# Patient Record
Sex: Female | Born: 1950 | Race: Black or African American | Hispanic: No | Marital: Single | Smoking: Former smoker
Health system: Southern US, Community
[De-identification: ages and names within clinical notes are randomized; demographics above are authoritative.]

## PROBLEM LIST (undated history)

## (undated) DIAGNOSIS — R351 Nocturia: Secondary | ICD-10-CM

## (undated) DIAGNOSIS — M2540 Effusion, unspecified joint: Secondary | ICD-10-CM

## (undated) DIAGNOSIS — R519 Headache, unspecified: Secondary | ICD-10-CM

## (undated) DIAGNOSIS — F32A Depression, unspecified: Secondary | ICD-10-CM

## (undated) DIAGNOSIS — M545 Low back pain, unspecified: Secondary | ICD-10-CM

## (undated) DIAGNOSIS — M2550 Pain in unspecified joint: Secondary | ICD-10-CM

## (undated) DIAGNOSIS — R609 Edema, unspecified: Secondary | ICD-10-CM

## (undated) DIAGNOSIS — I10 Essential (primary) hypertension: Secondary | ICD-10-CM

## (undated) DIAGNOSIS — F329 Major depressive disorder, single episode, unspecified: Secondary | ICD-10-CM

## (undated) DIAGNOSIS — M1990 Unspecified osteoarthritis, unspecified site: Secondary | ICD-10-CM

## (undated) DIAGNOSIS — G629 Polyneuropathy, unspecified: Secondary | ICD-10-CM

## (undated) DIAGNOSIS — H269 Unspecified cataract: Secondary | ICD-10-CM

## (undated) DIAGNOSIS — R600 Localized edema: Secondary | ICD-10-CM

## (undated) DIAGNOSIS — R51 Headache: Secondary | ICD-10-CM

## (undated) DIAGNOSIS — E785 Hyperlipidemia, unspecified: Secondary | ICD-10-CM

## (undated) DIAGNOSIS — G8929 Other chronic pain: Secondary | ICD-10-CM

## (undated) HISTORY — PX: COLONOSCOPY: SHX174

## (undated) HISTORY — PX: TOTAL KNEE ARTHROPLASTY: SHX125

## (undated) HISTORY — PX: JOINT REPLACEMENT: SHX530

## (undated) HISTORY — PX: KNEE ARTHROSCOPY: SHX127

## (undated) HISTORY — PX: BACK SURGERY: SHX140

## (undated) HISTORY — PX: CHOLECYSTECTOMY OPEN: SUR202

## (undated) HISTORY — PX: VAGINAL HYSTERECTOMY: SUR661

## (undated) HISTORY — PX: POSTERIOR FUSION CERVICAL SPINE: SUR628

---

## 2002-06-01 ENCOUNTER — Ambulatory Visit (HOSPITAL_COMMUNITY): Admission: RE | Admit: 2002-06-01 | Discharge: 2002-06-01 | Payer: Self-pay | Admitting: Geriatric Medicine

## 2002-06-01 ENCOUNTER — Encounter: Payer: Self-pay | Admitting: Internal Medicine

## 2003-07-17 ENCOUNTER — Inpatient Hospital Stay (HOSPITAL_COMMUNITY): Admission: RE | Admit: 2003-07-17 | Discharge: 2003-07-20 | Payer: Self-pay | Admitting: Orthopedic Surgery

## 2003-07-17 ENCOUNTER — Encounter (INDEPENDENT_AMBULATORY_CARE_PROVIDER_SITE_OTHER): Payer: Self-pay | Admitting: *Deleted

## 2003-07-20 ENCOUNTER — Inpatient Hospital Stay (HOSPITAL_COMMUNITY)
Admission: RE | Admit: 2003-07-20 | Discharge: 2003-07-29 | Payer: Self-pay | Admitting: Physical Medicine & Rehabilitation

## 2004-11-13 ENCOUNTER — Ambulatory Visit (HOSPITAL_COMMUNITY): Admission: RE | Admit: 2004-11-13 | Discharge: 2004-11-13 | Payer: Self-pay | Admitting: Geriatric Medicine

## 2005-11-14 ENCOUNTER — Ambulatory Visit (HOSPITAL_COMMUNITY): Admission: RE | Admit: 2005-11-14 | Discharge: 2005-11-14 | Payer: Self-pay | Admitting: Pulmonary Disease

## 2005-11-14 ENCOUNTER — Ambulatory Visit (HOSPITAL_COMMUNITY): Admission: RE | Admit: 2005-11-14 | Discharge: 2005-11-14 | Payer: Self-pay | Admitting: Internal Medicine

## 2006-03-04 ENCOUNTER — Emergency Department (HOSPITAL_COMMUNITY): Admission: EM | Admit: 2006-03-04 | Discharge: 2006-03-04 | Payer: Self-pay | Admitting: Emergency Medicine

## 2006-07-13 ENCOUNTER — Inpatient Hospital Stay (HOSPITAL_COMMUNITY): Admission: RE | Admit: 2006-07-13 | Discharge: 2006-07-17 | Payer: Self-pay | Admitting: Orthopedic Surgery

## 2006-07-14 ENCOUNTER — Ambulatory Visit: Payer: Self-pay | Admitting: Physical Medicine & Rehabilitation

## 2006-10-09 ENCOUNTER — Ambulatory Visit: Payer: Self-pay

## 2006-11-25 ENCOUNTER — Ambulatory Visit (HOSPITAL_COMMUNITY): Admission: RE | Admit: 2006-11-25 | Discharge: 2006-11-25 | Payer: Self-pay | Admitting: Internal Medicine

## 2008-06-09 ENCOUNTER — Ambulatory Visit (HOSPITAL_COMMUNITY): Admission: RE | Admit: 2008-06-09 | Discharge: 2008-06-09 | Payer: Self-pay | Admitting: Internal Medicine

## 2009-08-24 ENCOUNTER — Ambulatory Visit (HOSPITAL_COMMUNITY): Admission: RE | Admit: 2009-08-24 | Discharge: 2009-08-24 | Payer: Self-pay | Admitting: Internal Medicine

## 2010-06-02 ENCOUNTER — Encounter: Payer: Self-pay | Admitting: Internal Medicine

## 2010-09-27 NOTE — Op Note (Signed)
NAMEJAZMEN, LINDENBAUM              ACCOUNT NO.:  [id number]   MEDICAL RECORD NO.:  [id number]          PATIENT TYPE:  INP   LOCATION:  5017                         FACILITY:  [facility]   PHYSICIAN:  Loreta Ave, M.D. DATE OF BIRTH:  Nov 14, 1950   DATE OF PROCEDURE:  07/13/2006  DATE OF DISCHARGE:                               OPERATIVE REPORT   PREOPERATIVE DIAGNOSES:  End-stage degenerative arthritis, left knee,  valgus alignment; morbid obesity.   POSTOPERATIVE DIAGNOSES:  End-stage degenerative arthritis, left knee,  valgus alignment; morbid obesity.   PROCEDURE:  Left total knee replacement, Stryker prosthesis, soft tissue  balancing, cemented pegged posterior-stabilized #4 femoral component,  Triathlon type, cemented #4 tibial component with 9-mm polyethylene  insert, posterior stabilized, 32-mm medial offset cemented patellar  component.   SURGEON:  Loreta Ave, M.D.   ASSISTANT:  Genene Churn. Denton Meek., present throughout the entire case.   ANESTHESIA:  General.   BLOOD LOSS:  Minimal.   TOURNIQUET TIME:  1 hour 50 minutes.   SPECIMENS:  None.   CULTURES:  None.   COMPLICATIONS:  None.   DRESSING:  Sterile compressive with knee immobilizer.   DRAINS:  A Hemovac x1.   DESCRIPTION OF PROCEDURE:  The patient was brought to the operating room  and placed on the operating table in supine position.  After adequate  anesthesia had been obtained, the left knee was examined.  Positioning,  exam and the entire procedure made much more difficult because of  exogenous obesity.  Basically, full extension, increased valgus,  correctable to normal mechanical axis.  Lateral patellofemoral tracking  and tethering, although not extreme.  Flexion at about 90 degrees,  limited by her calf hitting the back of her thigh.  After complete exam,  tourniquet applied, prepped and draped usual sterile fashion.  Exsanguinated with elevation and Esmarch.  Tourniquet inflated to 350  mmHg.  Anterior incision above the patella down to tibial tubercle.  The  skin and subcutaneous tissue divided.  Medial arthrotomy extending up  into the distal quad.  Knee exposed.  Grade 4 changes throughout.  Numerous loose bodies.  Peri-articular spurs.  Remnants of menisci,  cruciate ligaments removed.  Distal femur exposed.  Intramedullary guide  placed.  Distal cut set with a normal mechanical axis, obviously  resecting more medially because of bony deficiency laterally.  Epicondylar axis marked after a 10-mm resection.  Sized, cut and fitted  with a #4 component, posterior stabilized and pegged.  Proximal tibia  resection, 3-degree posterior slope cut.  Extramedullary guide coming  down just low enough to be in subchondral bone laterally.  Multiple  drilling on top of the tibia laterally, as it was very sclerotic.  Sized  to a #4 component.  Trials were put in place, a #4 on the femur, a #4 on  the tibia, with a 9-mm insert.  After soft tissue balancing, I had full  extension, full flexion and a nicely balanced knee with good stability.  Tibia was marked for appropriate rotation and hand-reamed.  Patella was  then treated with resection of all the peri-articular  spurs.  Posterior  10 mm resected.  Sized, drilled and fitted for a 32-mm component.  With  trials in place again after freeing up soft tissues, especially  laterally, I had good patellofemoral tracking and stability.  All trials  removed.  All recesses examined.  Numerous loose bodies removed.  Copious irrigation with a pulsed irrigating device.  Cement prepared and  placed on all components, which were then firmly seated.  Excessive  cement removed.  Polyethylene attached to the tibia and the knee  reduced.  After the cement hardened, the knee was reexamined.  Full  extension, full flexion, good patellofemoral tracking and stability.  Hemovac placed.  Arthrotomy closed with #1 Vicryl, the skin and  subcutaneous tissue  with Vicryl and staples.  The knee injected with  Marcaine and Hemovac clamped.  Sterile compressive dressing applied.  Tourniquet deflated and removed.  Knee immobilizer applied.  Anesthesia  reversed.  Brought to the recovery room.  Tolerated surgery well with no  complications.      Loreta Ave, M.D.  Electronically Signed     DFM/MEDQ  D:  07/13/2006  T:  07/13/2006  Job:  [id number]

## 2010-09-27 NOTE — Op Note (Signed)
NAMEARMANI, GAWLIK                        ACCOUNT NO.:  [id number]   MEDICAL RECORD NO.:  [id number]                   PATIENT TYPE:  INP   LOCATION:  5004                                 FACILITY:  [facility]   PHYSICIAN:  Loreta Ave, M.D.              DATE OF BIRTH:  Dec 29, 1950   DATE OF PROCEDURE:  07/17/2003  DATE OF DISCHARGE:                                 OPERATIVE REPORT   PREOPERATIVE DIAGNOSES:  End stage degenerative arthritis, right knee.  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  Marked exogenous obesity.   POSTOPERATIVE DIAGNOSES:  End stage degenerative arthritis, right knee.  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  Marked exogenous obesity.   OPERATION PERFORMED:  Right total knee replacement Osteonics prosthesis.  Cemented #9 posterior stabilized femoral component.  Cemented #9 tibial  component with 15 mm posterior stabilized Flex insert.  Cemented recess 30  mm patellar component.  Removal of numerous osteochondral loose bodies and  relatively extensive synovectomy.   SURGEON:  Loreta Ave, M.D.   ASSISTANT:  Arlys John D. Petrarca, P.A.-C.   ANESTHESIA:  General.   ESTIMATED BLOOD LOSS:  Minimal.   SPECIMENS:  Loose bodies and synovium.   CULTURES:  None.   COMPLICATIONS:  None.   DRESSING:  Soft compressive.   TOURNIQUET TIME:  One hour and 30 minutes.   DRAINS:  Hemovac times two.   DESCRIPTION OF PROCEDURE:  The patient was brought to the operating room and  placed on the operating table in supine position.  After adequate anesthesia  had been obtained, the knee examined.  Almost full extension.  Increased  valgus alignment, grossly stable, flexion to 90 degrees.  Exam and all  exposure extremely difficult because of the degree of exogenous obesity.  The lower leg was too large to be fitted  with any standard TED hose.  Tourniquet applied to upper aspect of the thigh.  Prepped and draped in the  usual sterile fashion.  Exsanguinated elevation Esmarch.  Tourniquet  inflated to 400 mmHg.  Straight incision above the patella down to the  tibial tubercle.  Multiple layers of adipose tissue incised exposing the  extensor mechanism.  Appropriate retractors.  Medial parapatellar  arthrotomy.  Knee exposed.  There were more than 14 greater than 2 cm  osteochondral loose bodies counted throughout the knee and all of these were  removed.  Extensive periarticular spurs removed.  Grade 4 changes  throughout.  Hypertrophic synovium but I really did not see pebbling or  loose body formation within the synovium itself.  All of the hypertrophic  synovium resected from all compartments and part of this was sent to  pathology to rule out an osteochondroma formation pattern within the  synovium.  Remnants of menisci and cruciate ligaments exposed.  Distal femur  exposed.  Intramedullary guide  placed.  Distal cut of the femur removing 12  mm set at 5 degrees of valgus.  Good bone stock.  Sized to a #9 component.  Jigs put in place.  Definitive cuts made.  Attention was turned to the  tibia.  The tibial spine removed with the saw.  The intramedullary guide  placed.  Proximal cut removing 6 mm with a 5 degree posterior slope cut.  Patella was then sized, reamed and drilled for a 30 mm component.  Trials  put in place throughout.  #9 on the femur, #9 on the tibia and a 30 mm  patella.  With appropriate soft tissue balancing and release, a 15 mm Flex  insert was utilized. This gave me full extension, full flexion, nicely  balanced knee set at 5 degrees of valgus.  Tibia was marked for appropriate  rotation with the trials and then hand reamed.  At completion, the entire  knee was copiously irrigated with a pulse irrigating device.  All recesses  examined.  All loose bodies removed.  Cement placed on  all components which  were firmly seated and the polyethylene attached to tibial component.  After  cement had hardened and excessive cement removed, the knee was re-examined.  Full extension, flexion better than 90 degrees beyond which adipose tissue  between the thigh and the calf limited further flexion.  Good patellofemoral  tracking.  Wound was irrigated.  Hemovacs placed and brought out through  separate stab wounds.  Arthrotomy closed with #1 Vicryl.  Skin and  subcutaneous tissue with multiple layers of Vicryl and then staples on the  skin.  Margins of the wound and knee then injected with Marcaine.  Hemovacs  clamped.  Sterile compressive dressing applied.  Tourniquet deflated and  removed.  Anesthesia reversed.  Brought to recovery room.  Tolerated surgery  well.  No complications.                                               Loreta Ave, M.D.    DFM/MEDQ  D:  07/19/2003  T:  07/19/2003  Job:  [id number]

## 2010-09-27 NOTE — Discharge Summary (Signed)
Dawn Gross, Dawn Gross                        ACCOUNT NO.:  [id number]   MEDICAL RECORD NO.:  [id number]                   PATIENT TYPE:  INP   LOCATION:  5004                                 FACILITY:  [facility]   PHYSICIAN:  Loreta Ave, M.D.              DATE OF BIRTH:  [birth year]   DATE OF ADMISSION:  07/17/2003  DATE OF DISCHARGE:  07/20/2003                                 DISCHARGE SUMMARY   ADMISSION DIAGNOSIS:  Advanced degenerative joint disease of the right knee.   DISCHARGE DIAGNOSES:  1. Advanced degenerative joint disease of the right knee.  2. Hypertension.  3. Esophageal reflux.  4. Depressive disorder.   PROCEDURE:  Right total knee replacement.   HISTORY:  A 60 year old black female with advanced DJD of the right knee.  She had arthroscopic surgery approximately five to six years ago with  removal of loose bodies without much improvement.  She failed conservative  treatment.  She is now indicated for right total knee replacement.   HOSPITAL COURSE:  A 60 year old black female admitted July 17, 2003, after  appropriate laboratory studies were obtained as well as 1 g Ancef IV on call  to the operating room.  Was taken to the operating room where she underwent  a right total knee replacement.  She tolerated the procedure well.  She was  continued on Ancef 1 g IV q.8h. x 3 doses.  Heparin 5000 units  subcutaneously q.12h. was begun until Coumadin became therapeutic.  Placed  on high-dose Dilaudid PCA pump.  A Foley was placed intraoperatively.  CPM  from 0 to 50 degrees for 8 hours per day, increasing by 10 degrees a day was  ordered.  Consultation with PT, OT, and rehab made.  Ambulation  weightbearing as tolerated.  Foot pumps were placed to both legs  postoperatively.  Incentive spirometry was encouraged.  On March 9, Hemovacs  were pulled and her dressing was changed.  Wounds were benign.  She was  weaned off oral pain medicines.  She did have hypokalemia  which was  corrected with oral potassium.  She did have some elevation in her  temperature, and chest x-ray was ordered.  The remainder of her hospital  course, though, was unremarkable, and she was transferred to rehab on March  10.  She will return back to our office 10 to 14 days from surgery.   CONDITION ON DISCHARGE:  Improved.   LABORATORY AND X-RAY DATA:  Chest x-ray of July 20, 2003, revealed streaky  areas of atelectasis of both lower lobes.   Right knee on March 7 revealed right total knee replacement appears to be in  satisfactory position and alignment.  Postoperative changes are noted and  drain in the suprapatellar bursal space.   Laboratory studies show hemoglobin 12.8, hematocrit 38.6%, white count 5400,  and platelets 276,000.  Discharge hemoglobin 8.6, hematocrit 25.7%, white  count 6900, platelets 194,000.  Pro time at time of discharge was 14.2 with  an INR of 1.1.  Preop chemistries: Sodium 140, potassium 3.2, chloride 104,  CO2 29, glucose 88, BUN 14, creatinine 0.7, calcium 9.3, total protein 6.3,  albumin 3.7, AST 26, ALT 27, alkaline phosphatase 83, total bilirubin 0.4.  Discharge sodium 136, potassium 3.2, chloride 100, CO2 31, glucose 100, BUN  7, creatinine 0.7, calcium 8.3.  Urinalysis was benign for blood in urine.  Blood type was O positive, antibody screen negative.   DISCHARGE INSTRUCTIONS:  She was transferred to rehab where she will  continue with physical and occupational therapy as per protocol.  She was  discharged in improved condition on a house diet.      Oris Drone Petrarca, P.A.-C.                Loreta Ave, M.D.    BDP/MEDQ  D:  08/26/2003  T:  08/28/2003  Job:  [id number]

## 2010-09-27 NOTE — Discharge Summary (Signed)
Dawn Gross, Dawn Gross              ACCOUNT NO.:  [id number]   MEDICAL RECORD NO.:  [id number]          PATIENT TYPE:  INP   LOCATION:  5017                         FACILITY:  [facility]   PHYSICIAN:  Loreta Ave, M.D. DATE OF BIRTH:  24-Oct-1950   DATE OF ADMISSION:  07/13/2006  DATE OF DISCHARGE:  07/17/2006                               DISCHARGE SUMMARY   FINAL DIAGNOSES:  1. Status post left total knee replacement for end-stage degenerative      joint disease.  2. Hypertension.  3. Gastroesophageal reflux disease.  4. Diet controlled diabetes.  5. Hyperlipidemia.   HISTORY OF PRESENT ILLNESS:  A 60 year old white female with history of  DJD, left knee chronic pain, presented to our office for evaluation for  total knee replacement.  She had progressively worsening pain with  __________ response to conservative treatment.  Significant decrease in  her daily activities due to the ongoing complaint.   HOSPITAL COURSE:  07/13/06 the patient was taken to the [facility] OR  and a left total knee replacement procedure performed.  The surgeon  Loreta Ave, assistant Genene Churn. Barry Dienes, PA-C.  Anesthesia general.  No specimens.  EBL minimal.  One Hemovac drain placed.  Surgery time 2  hours.  There were no surgical or anesthesia complications and the  patient was transferred to recovery in stable condition.  07/14/06 the  patient doing well with good pain control.  No specific complaints.  Temperature 98.8, pulse 109, respirations 20, blood pressure 113/75.  WBCs 7.5, hemoglobin 9.9, hematocrit 29.5, platelets 186.  Sodium 137,  potassium 3, chloride 100, CO2 26, BUN 11, creatinine 0.58, glucose 142.  INR 1.1.  Dressing clean, dry and intact.  Calf nontender.  Neurovascularly intact.  PT and OT consult.  Pharmacy protocol Coumadin  started.  Replace potassium.  FeSO4 325 mg p.o. b.i.d. started.  Changed  morphine PCA to low-dose.  07/15/06 the patient doing well.  Temperature  100.6, pulse 112, respirations 20, blood pressure 112/67.  Hemoglobin  9.7, hematocrit 28.3.  Potassium 3.4, sodium 137, chloride 102, CO2 31,  BUN 10, creatinine 0.75, glucose 110.  INR 1.2.  Wound looked good,  staples intact.  No signs of infection.  Hemovac drain discontinued.  Calf nontender, neurovascularly intact.  Discontinued IV, PCA, O2 and  Foley.  Started Kay Ciel 20 mEq p.o. b.i.d.  Encourage incentive  spirometry.  07/16/06, the patient doing well with good pain control.  Temperature 98.1, pulse 69, respirations 18, blood pressure 107/79.  Hemoglobin 9.7, hematocrit 24.3, platelets 212.  INR 1.1.  B-met  pending.  Urine culture negative.  Would looked good, staples intact.  No drainage or signs of infection.  Calf nontender.  Neurovascularly  intact.  Awaiting rehab versus skilled nursing facility placement.  07/17/06, the patient doing very well.  Good pain control.  Complained  of some knee pain last night but nothing extreme.  Temperature 97.7,  pulse 83, respirations 18, blood pressure 110/68.  WBCs 6.2, hematocrit  25.4, hemoglobin 8.7, platelets 186.  Sodium 139, potassium 3.8,  chloride 98, CO2 29, BUN 11,  creatinine 0.54, glucose 96.  INR 1.3.  Wound looked good.  Staples intact.  No drainage or signs of infection.  Calf nontender.  Neurovascularly intact.  At this point, the patient  stable for discharge to a skilled facility.   CONDITION:  Good and stable.   MEDICATIONS:  1. Colace 100 mg p.o. b.i.d.  2. Coumadin pharmacy protocol to maintain INR 2-3.  3. Heparin 5,000 units subcutaneous injection q. 12 hours until INR 2-      3 and Coumadin therapeutic.  4. Triamterine/HCTZ 37.5/25 mg tablet p.o. daily.  5. Zocor 40 mg p.o. daily.  6. FeSO4 325 mg p.o. b.i.d. with meals x2 weeks.  7. Percocet 5/325 1-2 tabs p.o. q. 4-6 hours p.r.n. for pain.  8. Tylenol 325-650 mg p.o. q. 4-6 hours p.r.n. for temperature greater      than 101.  9. Robaxin 500 mg 1 tab p.o.  q. 6 hours p.r.n. for spasms.  10.Restoril 15-30 mg p.o. q. h.s. p.r.n. for sleep.   DISPOSITION:  Discharge to a skilled facility.   DISCHARGE INSTRUCTIONS:  __________ the patient will work with PT/OT to  improve ambulation and range of motion and strengthening.  She is weight-  bearing as tolerated with a walker.  Will remain on Coumadin  x4 weeks  postop for DVT prophylaxis.  Daily dry dressing changes with 4 x 4 gauze  and tape.  The patient is okay to shower.  No tub soaking.  She will  return to our office 2 weeks postop for recheck and possible staple  removal.  Advised to notify us immediately if there are any questions or  concerns before that time.  Our office can be reached at [phone].      Genene Churn. Denton Meek.      Loreta Ave, M.D.  Electronically Signed    JMO/MEDQ  D:  07/17/2006  T:  07/17/2006  Job:  [id number]

## 2010-09-27 NOTE — Discharge Summary (Signed)
NAMERITAL, CAVEY                        ACCOUNT NO.:  [id number]   MEDICAL RECORD NO.:  [id number]                   PATIENT TYPE:  IPS   LOCATION:  4143                                 FACILITY:  [facility]   PHYSICIAN:  Ranelle Oyster, M.D.             DATE OF BIRTH:  03/11/1951   DATE OF ADMISSION:  07/20/2003  DATE OF DISCHARGE:  07/29/2003                                 DISCHARGE SUMMARY   DISCHARGE DIAGNOSES:  1. Right total knee replacement July 17, 2003, secondary to degenerative     joint disease.  2. Pain management.  3. Anemia.  4. Coumadin for deep venous thrombosis prophylaxis.  5. Hypertension.  6. Depression.  7. Gastroesophageal reflux disease.  8. Morbid obesity.   HISTORY OF PRESENT ILLNESS:  A 60 year old female on July 17, 2003, with end-  stage degenerative joint disease of the right knee and no relief with  conservative care.  She underwent a total knee replacement on July 17, 2003,  per Dr. Eulah Pont.  Placed on Coumadin for deep venous thrombosis prophylaxis,  weightbearing as tolerated.  Hospital course uneventful.  No chest pain, no  nausea or vomiting, minimal assist for bed mobility and transfers.  Admitted  for comprehensive  rehab program.   PAST MEDICAL HISTORY:  See discharge diagnoses.   PAST SURGICAL HISTORY:  1. Cholecystectomy.  2. Hypertension.   ALLERGIES:  No known drug allergies.   MEDICATIONS PRIOR TO ADMISSION:  1. Hydrocodone.  2. Atenolol.  3. Hydrochlorothiazide.  4. Zoloft.  5. Nexium.   SOCIAL HISTORY:  Occasional alcohol, remote smoker.  [place]  [place] Independent with a cane prior to admission, on  disability.  One level home, one step to entry.  Family to check on as  needed.   PRIMARY CARE PHYSICIAN:  Dr. Doyne Keel of [place], [place].   HOSPITAL COURSE:  The patient did well while in rehabilitation services with  therapies initiated on a b.i.d. basis.  The following issues  are followed  during the patient's rehab course.  Pertaining to Ms. Bowerman's right total  knee replacement, surgical site healing nicely.  Staples have been removed.  No signs of infection.  She was ambulating with a walker, weightbearing as  tolerated.  She would follow with Dr. Eulah Pont.  She continued on Coumadin for  deep venous thrombosis prophylaxis.  [facility] health agency [facility] had been  contacted to complete Coumadin protocol with next prothrombin time July 31, 2003.  Postoperative anemia, 8.9.  No bleeding episodes noted.  Her blood  pressures were controlled on Tenormin and hydrochlorothiazide.  She  continued on her home regimen of Zoloft for history of depression.  She was  alert, cooperative, participating fully with her therapies during her rehab  stay.  She had no bowel or bladder disturbances.  Overall, for her  functional mobility, as pain medicines were adjusted for better control,  she  was placed on sustained release OxyContin which was able to allow her to  participate fully with her therapies.  She was close supervision for  ambulation, knee flexion 90 degrees, modified independent for activities of  daily living, home health therapies had been arranged.   DISCHARGE MEDICATIONS:  At the time of dictation, medications included:  1. Coumadin with latest dose of 3 mg to be completed August 17, 2003.  2. Os-Cal 500 mg daily.  3. Trinsicon one capsule twice daily.  4. Tenormin 50 mg daily.  5. Zoloft 50 mg at bedtime.  6. Hydrochlorothiazide 25 mg daily.  7. Protonix 40 mg daily.  8. OxyContin sustained release 10 mg every 12 hours x1 week and discontinue.  9. Potassium chloride 20 mEq daily.  10.      Oxycodone as needed for breakthrough pain.   ACTIVITY:  Weightbearing as tolerated.   DIET:  Regular.   WOUND CARE:  Cleanse incision daily with warm soap and water.   SPECIAL INSTRUCTIONS:  Home health nurse per [facility] to  check next prothrombin  time on July 31, 2003.  Home health physical and  occupational therapy arranged.  The patient should follow up with Dr. Mckinley Jewel, orthopedic services, call for appointment.      Mariam Dollar, P.A.                     Ranelle Oyster, M.D.    DA/MEDQ  D:  07/28/2003  T:  07/31/2003  Job:  [id number]   cc:   Loreta Ave, M.D.  [address]  [place] [address]  Fax: [phone]   Dr. Aundra Millet, [place]

## 2010-10-15 ENCOUNTER — Other Ambulatory Visit (HOSPITAL_COMMUNITY): Payer: Self-pay | Admitting: Internal Medicine

## 2010-10-15 DIAGNOSIS — Z139 Encounter for screening, unspecified: Secondary | ICD-10-CM

## 2010-10-21 ENCOUNTER — Ambulatory Visit (HOSPITAL_COMMUNITY)
Admission: RE | Admit: 2010-10-21 | Discharge: 2010-10-21 | Disposition: A | Discharge status: Home / Self Care | Payer: Medicare Other | Source: Ambulatory Visit | Attending: Internal Medicine | Admitting: Internal Medicine

## 2010-10-21 DIAGNOSIS — Z1231 Encounter for screening mammogram for malignant neoplasm of breast: Secondary | ICD-10-CM | POA: Insufficient documentation

## 2010-10-21 DIAGNOSIS — Z139 Encounter for screening, unspecified: Secondary | ICD-10-CM

## 2013-02-16 ENCOUNTER — Other Ambulatory Visit: Payer: Self-pay | Admitting: Neurosurgery

## 2013-03-09 ENCOUNTER — Encounter (HOSPITAL_COMMUNITY): Payer: Self-pay

## 2013-03-09 ENCOUNTER — Ambulatory Visit (HOSPITAL_COMMUNITY)
Admission: RE | Admit: 2013-03-09 | Discharge: 2013-03-09 | Disposition: A | Discharge status: Home / Self Care | Payer: Medicare Other | Source: Ambulatory Visit | Attending: Anesthesiology | Admitting: Anesthesiology

## 2013-03-09 ENCOUNTER — Encounter (HOSPITAL_COMMUNITY)
Admission: RE | Admit: 2013-03-09 | Discharge: 2013-03-09 | Disposition: A | Discharge status: Home / Self Care | Payer: Medicare Other | Source: Ambulatory Visit | Attending: Neurosurgery | Admitting: Neurosurgery

## 2013-03-09 ENCOUNTER — Encounter (HOSPITAL_COMMUNITY): Payer: Self-pay | Admitting: Pharmacy Technician

## 2013-03-09 DIAGNOSIS — Z01818 Encounter for other preprocedural examination: Secondary | ICD-10-CM | POA: Insufficient documentation

## 2013-03-09 DIAGNOSIS — I498 Other specified cardiac arrhythmias: Secondary | ICD-10-CM | POA: Insufficient documentation

## 2013-03-09 DIAGNOSIS — Z01810 Encounter for preprocedural cardiovascular examination: Secondary | ICD-10-CM | POA: Insufficient documentation

## 2013-03-09 DIAGNOSIS — Z01812 Encounter for preprocedural laboratory examination: Secondary | ICD-10-CM | POA: Insufficient documentation

## 2013-03-09 DIAGNOSIS — R9431 Abnormal electrocardiogram [ECG] [EKG]: Secondary | ICD-10-CM | POA: Insufficient documentation

## 2013-03-09 DIAGNOSIS — I517 Cardiomegaly: Secondary | ICD-10-CM | POA: Insufficient documentation

## 2013-03-09 HISTORY — DX: Unspecified osteoarthritis, unspecified site: M19.90

## 2013-03-09 HISTORY — DX: Essential (primary) hypertension: I10

## 2013-03-09 LAB — SURGICAL PCR SCREEN
MRSA, PCR: NEGATIVE
Staphylococcus aureus: NEGATIVE

## 2013-03-09 LAB — BASIC METABOLIC PANEL
CO2: 30 mEq/L (ref 19–32)
Calcium: 9.3 mg/dL (ref 8.4–10.5)
Chloride: 104 mEq/L (ref 96–112)
Sodium: 142 mEq/L (ref 135–145)

## 2013-03-09 LAB — CBC
HCT: 35.9 % — ABNORMAL LOW (ref 36.0–46.0)
MCV: 89.1 fL (ref 78.0–100.0)
Platelets: 237 10*3/uL (ref 150–400)
RBC: 4.03 MIL/uL (ref 3.87–5.11)
WBC: 5.8 10*3/uL (ref 4.0–10.5)

## 2013-03-09 LAB — TYPE AND SCREEN: ABO/RH(D): O POS

## 2013-03-09 NOTE — Progress Notes (Signed)
req'd notes any cardiac tests, ekg from dr vyas ib [place]

## 2013-03-09 NOTE — Pre-Procedure Instructions (Addendum)
IA LEEB  03/09/2013   Your procedure is scheduled on:  03/17/13  Report to [facility]  2 * 3 at 530 AM.  Call this number if you have problems the morning of surgery: [phone]   Remember:   Do not eat food or drink liquids after midnight.   Take these medicines the morning of surgery with A SIP OF WATER: pain med       STOP  Fish oil , diclofenac, ibuprofen on 03/13/13   Do not wear jewelry, make-up or nail polish.  Do not wear lotions, powders, or perfumes. You may wear deodorant.  Do not shave 48 hours prior to surgery. Men may shave face and neck.  Do not bring valuables to the hospital.  [facility] is not responsible                  for any belongings or valuables.               Contacts, dentures or bridgework may not be worn into surgery.  Leave suitcase in the car. After surgery it may be brought to your room.  For patients admitted to the hospital, discharge time is determined by your                treatment team.               Patients discharged the day of surgery will not be allowed to drive  home.  Name and phone number of your driver:   Special Instructions: Shower using CHG 2 nights before surgery and the night before surgery.  If you shower the day of surgery use CHG.  Use special wash - you have one bottle of CHG for all showers.  You should use approximately 1/3 of the bottle for each shower.   Please read over the following fact sheets that you were given: Pain Booklet, Coughing and Deep Breathing, Blood Transfusion Information, MRSA Information and Surgical Site Infection Prevention

## 2013-03-10 NOTE — Progress Notes (Signed)
Anesthesia Chart Review:  Patient is a 62 year old female scheduled for L3-4, L4-5, L5-S1 laminectomies with PLIF on 03/17/13 by Dr. Lovell Sheehan.  History includes former smoker, morbid obesity (BMI 49.9), HTN, arthritis, hysterectomy, cholecystectomy, bilateral TKA '05 (R) and '08 (L). PCP is Dr. Sherril Croon in [place].  EKG on 03/09/13 showed SB @ 57 bpm, low voltage QRS new when compared to her 07/08/06 EKG but somewhat similar in V3-6 when compared to her 03/04/06 EKG.  There was no significant T wave abnormality. She did not report any prior cardiac testing on her Health History form.  No CV symptoms were documented during her PAT visit.  CXR on 03/09/13 showed stable mild cardiomegaly. No active lung disease.   Preoperative labs noted.  Patient is morbidly obese with a history of HTN.  She has no reported history of CAD/MI, CHF, or DM.  She will be further evaluated by her assigned anesthesiologist on the day of surgery.  If she remains asymptomatic from a CV standpoint then I would anticipate that she could proceed as planned.  Velna [facility] Short Stay Center/Anesthesiology Phone [phone] 03/10/2013 12:49 PM

## 2013-03-16 MED ORDER — DEXTROSE 5 % IV SOLN
3.0000 g | INTRAVENOUS | Status: AC
  Administered 2013-03-17 (×2): 3 g via INTRAVENOUS
  Filled 2013-03-16: qty 3000

## 2013-03-17 ENCOUNTER — Encounter (HOSPITAL_COMMUNITY)
Admission: RE | Disposition: A | Discharge status: Home Health | Payer: Medicare Other | Source: Ambulatory Visit | Attending: Neurosurgery

## 2013-03-17 ENCOUNTER — Encounter (HOSPITAL_COMMUNITY): Payer: Medicare Other | Admitting: Vascular Surgery

## 2013-03-17 ENCOUNTER — Inpatient Hospital Stay (HOSPITAL_COMMUNITY)
Admission: RE | Admit: 2013-03-17 | Discharge: 2013-03-23 | DRG: 460 | Disposition: A | Discharge status: Home Health | Payer: Medicare Other | Source: Ambulatory Visit | Attending: Neurosurgery | Admitting: Neurosurgery

## 2013-03-17 ENCOUNTER — Encounter (HOSPITAL_COMMUNITY): Payer: Self-pay | Admitting: *Deleted

## 2013-03-17 ENCOUNTER — Inpatient Hospital Stay (HOSPITAL_COMMUNITY): Payer: Medicare Other

## 2013-03-17 ENCOUNTER — Inpatient Hospital Stay (HOSPITAL_COMMUNITY): Payer: Medicare Other | Admitting: Anesthesiology

## 2013-03-17 DIAGNOSIS — M129 Arthropathy, unspecified: Secondary | ICD-10-CM | POA: Diagnosis present

## 2013-03-17 DIAGNOSIS — M4310 Spondylolisthesis, site unspecified: Secondary | ICD-10-CM | POA: Diagnosis present

## 2013-03-17 DIAGNOSIS — M51379 Other intervertebral disc degeneration, lumbosacral region without mention of lumbar back pain or lower extremity pain: Principal | ICD-10-CM | POA: Diagnosis present

## 2013-03-17 DIAGNOSIS — M5137 Other intervertebral disc degeneration, lumbosacral region: Principal | ICD-10-CM | POA: Diagnosis present

## 2013-03-17 DIAGNOSIS — M4316 Spondylolisthesis, lumbar region: Secondary | ICD-10-CM

## 2013-03-17 DIAGNOSIS — D62 Acute posthemorrhagic anemia: Secondary | ICD-10-CM | POA: Diagnosis not present

## 2013-03-17 DIAGNOSIS — M48062 Spinal stenosis, lumbar region with neurogenic claudication: Secondary | ICD-10-CM | POA: Diagnosis present

## 2013-03-17 DIAGNOSIS — Z79899 Other long term (current) drug therapy: Secondary | ICD-10-CM

## 2013-03-17 DIAGNOSIS — Z87891 Personal history of nicotine dependence: Secondary | ICD-10-CM

## 2013-03-17 DIAGNOSIS — I10 Essential (primary) hypertension: Secondary | ICD-10-CM | POA: Diagnosis present

## 2013-03-17 SURGERY — POSTERIOR LUMBAR FUSION 3 LEVEL
Anesthesia: General | Site: Back | Wound class: Clean

## 2013-03-17 MED ORDER — SODIUM CHLORIDE 0.9 % IR SOLN
Status: DC | PRN
  Administered 2013-03-17: 10:00:00

## 2013-03-17 MED ORDER — ONDANSETRON HCL 4 MG/2ML IJ SOLN: 4.0000 mg | INTRAMUSCULAR | Status: DC | PRN

## 2013-03-17 MED ORDER — BUPIVACAINE LIPOSOME 1.3 % IJ SUSP
INTRAMUSCULAR | Status: DC | PRN
  Administered 2013-03-17: 20 mL

## 2013-03-17 MED ORDER — MIDAZOLAM HCL 5 MG/5ML IJ SOLN
INTRAMUSCULAR | Status: DC | PRN
  Administered 2013-03-17: 2 mg via INTRAVENOUS

## 2013-03-17 MED ORDER — MORPHINE SULFATE 2 MG/ML IJ SOLN: 1.0000 mg | INTRAMUSCULAR | Status: DC | PRN

## 2013-03-17 MED ORDER — OXYCODONE HCL 5 MG PO TABS
5.0000 mg | ORAL_TABLET | Freq: Once | ORAL | Status: AC | PRN
  Administered 2013-03-17: 5 mg via ORAL

## 2013-03-17 MED ORDER — PROPOFOL 10 MG/ML IV BOLUS
INTRAVENOUS | Status: DC | PRN
  Administered 2013-03-17: 150 mg via INTRAVENOUS
  Administered 2013-03-17: 50 mg via INTRAVENOUS

## 2013-03-17 MED ORDER — DIPHENHYDRAMINE HCL 12.5 MG/5ML PO ELIX: 12.5000 mg | ORAL_SOLUTION | Freq: Four times a day (QID) | ORAL | Status: DC | PRN

## 2013-03-17 MED ORDER — FUROSEMIDE 20 MG PO TABS
20.0000 mg | ORAL_TABLET | Freq: Every day | ORAL | Status: DC | PRN
  Filled 2013-03-17: qty 1

## 2013-03-17 MED ORDER — MENTHOL 3 MG MT LOZG: 1.0000 | LOZENGE | OROMUCOSAL | Status: DC | PRN

## 2013-03-17 MED ORDER — SODIUM CHLORIDE 0.9 % IJ SOLN: 9.0000 mL | INTRAMUSCULAR | Status: DC | PRN

## 2013-03-17 MED ORDER — SODIUM CHLORIDE 0.9 % IV SOLN
INTRAVENOUS | Status: DC | PRN
  Administered 2013-03-17: 09:00:00 via INTRAVENOUS

## 2013-03-17 MED ORDER — LACTATED RINGERS IV SOLN
INTRAVENOUS | Status: DC
  Administered 2013-03-17: 1000 mL via INTRAVENOUS

## 2013-03-17 MED ORDER — BUPIVACAINE-EPINEPHRINE PF 0.5-1:200000 % IJ SOLN
INTRAMUSCULAR | Status: DC | PRN
  Administered 2013-03-17: 10 mL

## 2013-03-17 MED ORDER — HYDROMORPHONE HCL PF 1 MG/ML IJ SOLN: 0.2500 mg | INTRAMUSCULAR | Status: DC | PRN

## 2013-03-17 MED ORDER — MORPHINE SULFATE (PF) 1 MG/ML IV SOLN
INTRAVENOUS | Status: DC
  Administered 2013-03-17: 4.5 mg via INTRAVENOUS
  Administered 2013-03-17: 17:00:00 via INTRAVENOUS
  Administered 2013-03-18: 1.5 mg via INTRAVENOUS
  Administered 2013-03-18: 3 mg via INTRAVENOUS
  Administered 2013-03-18 (×2): 1.5 mg via INTRAVENOUS
  Administered 2013-03-18: 4.5 mg via INTRAVENOUS

## 2013-03-17 MED ORDER — ONDANSETRON HCL 4 MG/2ML IJ SOLN: 4.0000 mg | Freq: Four times a day (QID) | INTRAMUSCULAR | Status: DC | PRN

## 2013-03-17 MED ORDER — ATORVASTATIN CALCIUM 20 MG PO TABS
20.0000 mg | ORAL_TABLET | Freq: Every day | ORAL | Status: DC
  Administered 2013-03-18 – 2013-03-23 (×6): 20 mg via ORAL
  Filled 2013-03-17 (×7): qty 1

## 2013-03-17 MED ORDER — ACETAMINOPHEN 650 MG RE SUPP: 650.0000 mg | RECTAL | Status: DC | PRN

## 2013-03-17 MED ORDER — ALUM & MAG HYDROXIDE-SIMETH 200-200-20 MG/5ML PO SUSP: 30.0000 mL | Freq: Four times a day (QID) | ORAL | Status: DC | PRN

## 2013-03-17 MED ORDER — PHENYLEPHRINE HCL 10 MG/ML IJ SOLN
INTRAMUSCULAR | Status: DC | PRN
  Administered 2013-03-17: 40 ug via INTRAVENOUS
  Administered 2013-03-17: 80 ug via INTRAVENOUS

## 2013-03-17 MED ORDER — GLYCOPYRROLATE 0.2 MG/ML IJ SOLN
INTRAMUSCULAR | Status: DC | PRN
  Administered 2013-03-17: .8 mg via INTRAVENOUS

## 2013-03-17 MED ORDER — ROCURONIUM BROMIDE 100 MG/10ML IV SOLN
INTRAVENOUS | Status: DC | PRN
  Administered 2013-03-17: 50 mg via INTRAVENOUS

## 2013-03-17 MED ORDER — CEFAZOLIN SODIUM-DEXTROSE 2-3 GM-% IV SOLR
2.0000 g | Freq: Three times a day (TID) | INTRAVENOUS | Status: AC
  Administered 2013-03-17 – 2013-03-18 (×2): 2 g via INTRAVENOUS
  Filled 2013-03-17 (×2): qty 50

## 2013-03-17 MED ORDER — OXYCODONE-ACETAMINOPHEN 5-325 MG PO TABS
1.0000 | ORAL_TABLET | ORAL | Status: DC | PRN
  Administered 2013-03-19: 2 via ORAL
  Filled 2013-03-17: qty 2

## 2013-03-17 MED ORDER — OXYCODONE HCL 5 MG PO TABS
ORAL_TABLET | ORAL | Status: AC
  Filled 2013-03-17: qty 1

## 2013-03-17 MED ORDER — ACETAMINOPHEN 325 MG PO TABS
650.0000 mg | ORAL_TABLET | ORAL | Status: DC | PRN
  Administered 2013-03-21 – 2013-03-23 (×2): 650 mg via ORAL
  Filled 2013-03-17 (×3): qty 2

## 2013-03-17 MED ORDER — ESMOLOL HCL 10 MG/ML IV SOLN
INTRAVENOUS | Status: DC | PRN
  Administered 2013-03-17: 20 mg via INTRAVENOUS

## 2013-03-17 MED ORDER — LISINOPRIL 20 MG PO TABS
20.0000 mg | ORAL_TABLET | Freq: Every day | ORAL | Status: DC
  Administered 2013-03-19 – 2013-03-21 (×3): 20 mg via ORAL
  Filled 2013-03-17 (×7): qty 1

## 2013-03-17 MED ORDER — NALOXONE HCL 0.4 MG/ML IJ SOLN: 0.4000 mg | INTRAMUSCULAR | Status: DC | PRN

## 2013-03-17 MED ORDER — MORPHINE SULFATE (PF) 1 MG/ML IV SOLN
INTRAVENOUS | Status: AC
  Filled 2013-03-17: qty 25

## 2013-03-17 MED ORDER — ARTIFICIAL TEARS OP OINT
TOPICAL_OINTMENT | OPHTHALMIC | Status: DC | PRN
  Administered 2013-03-17: 1 via OPHTHALMIC

## 2013-03-17 MED ORDER — LIDOCAINE HCL (CARDIAC) 20 MG/ML IV SOLN
INTRAVENOUS | Status: DC | PRN
  Administered 2013-03-17: 80 mg via INTRAVENOUS

## 2013-03-17 MED ORDER — FENTANYL CITRATE 0.05 MG/ML IJ SOLN
INTRAMUSCULAR | Status: DC | PRN
  Administered 2013-03-17: 50 ug via INTRAVENOUS
  Administered 2013-03-17 (×2): 100 ug via INTRAVENOUS
  Administered 2013-03-17 (×6): 50 ug via INTRAVENOUS
  Administered 2013-03-17: 100 ug via INTRAVENOUS
  Administered 2013-03-17 (×2): 50 ug via INTRAVENOUS

## 2013-03-17 MED ORDER — DIAZEPAM 5 MG PO TABS
5.0000 mg | ORAL_TABLET | Freq: Four times a day (QID) | ORAL | Status: DC | PRN
  Administered 2013-03-20 – 2013-03-22 (×3): 5 mg via ORAL
  Filled 2013-03-17 (×3): qty 1

## 2013-03-17 MED ORDER — DIPHENHYDRAMINE HCL 50 MG/ML IJ SOLN: 12.5000 mg | Freq: Four times a day (QID) | INTRAMUSCULAR | Status: DC | PRN

## 2013-03-17 MED ORDER — 0.9 % SODIUM CHLORIDE (POUR BTL) OPTIME
TOPICAL | Status: DC | PRN
  Administered 2013-03-17: 1000 mL

## 2013-03-17 MED ORDER — PROMETHAZINE HCL 25 MG/ML IJ SOLN: 6.2500 mg | INTRAMUSCULAR | Status: DC | PRN

## 2013-03-17 MED ORDER — POTASSIUM CHLORIDE ER 10 MEQ PO TBCR
10.0000 meq | EXTENDED_RELEASE_TABLET | Freq: Every day | ORAL | Status: DC | PRN
  Filled 2013-03-17: qty 1

## 2013-03-17 MED ORDER — DOCUSATE SODIUM 100 MG PO CAPS
100.0000 mg | ORAL_CAPSULE | Freq: Two times a day (BID) | ORAL | Status: DC
  Administered 2013-03-17 – 2013-03-23 (×11): 100 mg via ORAL
  Filled 2013-03-17 (×7): qty 1

## 2013-03-17 MED ORDER — EPHEDRINE SULFATE 50 MG/ML IJ SOLN
INTRAMUSCULAR | Status: DC | PRN
  Administered 2013-03-17 (×2): 5 mg via INTRAVENOUS
  Administered 2013-03-17: 10 mg via INTRAVENOUS
  Administered 2013-03-17: 5 mg via INTRAVENOUS
  Administered 2013-03-17: 10 mg via INTRAVENOUS

## 2013-03-17 MED ORDER — HYDROCODONE-ACETAMINOPHEN 5-325 MG PO TABS
1.0000 | ORAL_TABLET | ORAL | Status: DC | PRN
  Administered 2013-03-19 – 2013-03-21 (×8): 2 via ORAL
  Administered 2013-03-22: 1 via ORAL
  Filled 2013-03-17 (×4): qty 2
  Filled 2013-03-17: qty 1
  Filled 2013-03-17 (×4): qty 2

## 2013-03-17 MED ORDER — NEOSTIGMINE METHYLSULFATE 1 MG/ML IJ SOLN
INTRAMUSCULAR | Status: DC | PRN
  Administered 2013-03-17: 5 mg via INTRAVENOUS

## 2013-03-17 MED ORDER — LISINOPRIL-HYDROCHLOROTHIAZIDE 20-12.5 MG PO TABS: 1.0000 | ORAL_TABLET | Freq: Every day | ORAL | Status: DC

## 2013-03-17 MED ORDER — OXYCODONE HCL 5 MG/5ML PO SOLN: 5.0000 mg | Freq: Once | ORAL | Status: AC | PRN

## 2013-03-17 MED ORDER — HYDROCHLOROTHIAZIDE 12.5 MG PO CAPS
12.5000 mg | ORAL_CAPSULE | Freq: Every day | ORAL | Status: DC
  Administered 2013-03-19 – 2013-03-21 (×3): 12.5 mg via ORAL
  Filled 2013-03-17 (×7): qty 1

## 2013-03-17 MED ORDER — LACTATED RINGERS IV SOLN
INTRAVENOUS | Status: DC | PRN
  Administered 2013-03-17 (×3): via INTRAVENOUS

## 2013-03-17 MED ORDER — ONDANSETRON HCL 4 MG/2ML IJ SOLN
INTRAMUSCULAR | Status: DC | PRN
  Administered 2013-03-17: 4 mg via INTRAVENOUS

## 2013-03-17 MED ORDER — THROMBIN 20000 UNITS EX SOLR
CUTANEOUS | Status: DC | PRN
  Administered 2013-03-17 (×2): via TOPICAL

## 2013-03-17 MED ORDER — BUPIVACAINE LIPOSOME 1.3 % IJ SUSP
20.0000 mL | INTRAMUSCULAR | Status: AC
  Filled 2013-03-17: qty 20

## 2013-03-17 MED ORDER — CEFAZOLIN SODIUM 1-5 GM-% IV SOLN
INTRAVENOUS | Status: AC
  Filled 2013-03-17: qty 150

## 2013-03-17 MED ORDER — PHENOL 1.4 % MT LIQD: 1.0000 | OROMUCOSAL | Status: DC | PRN

## 2013-03-17 MED ORDER — ALBUMIN HUMAN 5 % IV SOLN
INTRAVENOUS | Status: DC | PRN
  Administered 2013-03-17: 10:00:00 via INTRAVENOUS

## 2013-03-17 MED ORDER — ZOLPIDEM TARTRATE 5 MG PO TABS
5.0000 mg | ORAL_TABLET | Freq: Every evening | ORAL | Status: DC | PRN
  Administered 2013-03-21: 5 mg via ORAL
  Filled 2013-03-17: qty 1

## 2013-03-17 MED ORDER — BACITRACIN ZINC 500 UNIT/GM EX OINT
TOPICAL_OINTMENT | CUTANEOUS | Status: DC | PRN
  Administered 2013-03-17: 1 via TOPICAL

## 2013-03-17 SURGICAL SUPPLY — 76 items
APL SKNCLS STERI-STRIP NONHPOA (GAUZE/BANDAGES/DRESSINGS) ×1
BAG DECANTER FOR FLEXI CONT (MISCELLANEOUS) ×2 IMPLANT
BENZOIN TINCTURE PRP APPL 2/3 (GAUZE/BANDAGES/DRESSINGS) ×2 IMPLANT
BLADE SURG 15 STRL LF DISP TIS (BLADE) IMPLANT
BLADE SURG 15 STRL SS (BLADE) ×2
BLADE SURG ROTATE 9660 (MISCELLANEOUS) IMPLANT
BRUSH SCRUB EZ PLAIN DRY (MISCELLANEOUS) ×2 IMPLANT
BUR ACORN 6.0 (BURR) ×2 IMPLANT
BUR MATCHSTICK NEURO 3.0 LAGG (BURR) ×2 IMPLANT
CANISTER SUCT 3000ML (MISCELLANEOUS) ×2 IMPLANT
CAP REVERE LOCKING (Cap) ×8 IMPLANT
CONN CROSSLINK REV 38-50MM (Connector) IMPLANT
CONN CROSSLINK REV 6.35 48-60 (Connector) ×2 IMPLANT
CONNECTOR CRSLINK REV 38-50MM (Connector) IMPLANT
CONNECTOR CRSLNK REV6.35 48-60 (Connector) IMPLANT
CONT SPEC 4OZ CLIKSEAL STRL BL (MISCELLANEOUS) ×3 IMPLANT
COVER BACK TABLE 24X17X13 BIG (DRAPES) IMPLANT
DRAPE C-ARM 42X72 X-RAY (DRAPES) ×4 IMPLANT
DRAPE LAPAROTOMY 100X72X124 (DRAPES) ×2 IMPLANT
DRAPE POUCH INSTRU U-SHP 10X18 (DRAPES) ×2 IMPLANT
DRAPE PROXIMA HALF (DRAPES) ×3 IMPLANT
DRAPE SURG 17X23 STRL (DRAPES) ×8 IMPLANT
ELECT BLADE 4.0 EZ CLEAN MEGAD (MISCELLANEOUS) ×2
ELECT REM PT RETURN 9FT ADLT (ELECTROSURGICAL) ×2
ELECTRODE BLDE 4.0 EZ CLN MEGD (MISCELLANEOUS) ×1 IMPLANT
ELECTRODE REM PT RTRN 9FT ADLT (ELECTROSURGICAL) ×1 IMPLANT
EVACUATOR 1/8 PVC DRAIN (DRAIN) ×1 IMPLANT
GAUZE SPONGE 4X4 16PLY XRAY LF (GAUZE/BANDAGES/DRESSINGS) ×2 IMPLANT
GLOVE BIO SURGEON STRL SZ8.5 (GLOVE) ×4 IMPLANT
GLOVE BIOGEL PI IND STRL 7.5 (GLOVE) IMPLANT
GLOVE BIOGEL PI INDICATOR 7.5 (GLOVE) ×3
GLOVE ECLIPSE 7.5 STRL STRAW (GLOVE) ×2 IMPLANT
GLOVE EXAM NITRILE LRG STRL (GLOVE) IMPLANT
GLOVE EXAM NITRILE MD LF STRL (GLOVE) IMPLANT
GLOVE EXAM NITRILE XL STR (GLOVE) IMPLANT
GLOVE EXAM NITRILE XS STR PU (GLOVE) IMPLANT
GLOVE SS BIOGEL STRL SZ 8 (GLOVE) ×2 IMPLANT
GLOVE SUPERSENSE BIOGEL SZ 8 (GLOVE) ×2
GLOVE SURG SS PI 7.0 STRL IVOR (GLOVE) ×3 IMPLANT
GOWN BRE IMP SLV AUR LG STRL (GOWN DISPOSABLE) ×1 IMPLANT
GOWN BRE IMP SLV AUR XL STRL (GOWN DISPOSABLE) ×6 IMPLANT
GOWN STRL REIN 2XL LVL4 (GOWN DISPOSABLE) IMPLANT
KIT BASIN OR (CUSTOM PROCEDURE TRAY) ×2 IMPLANT
KIT ROOM TURNOVER OR (KITS) ×2 IMPLANT
NDL HYPO 21X1.5 SAFETY (NEEDLE) IMPLANT
NEEDLE HYPO 21X1.5 SAFETY (NEEDLE) ×2 IMPLANT
NEEDLE HYPO 22GX1.5 SAFETY (NEEDLE) ×2 IMPLANT
NS IRRIG 1000ML POUR BTL (IV SOLUTION) ×2 IMPLANT
PACK FOAM VITOSS 10CC (Orthopedic Implant) ×2 IMPLANT
PACK LAMINECTOMY NEURO (CUSTOM PROCEDURE TRAY) ×2 IMPLANT
PAD ARMBOARD 7.5X6 YLW CONV (MISCELLANEOUS) ×6 IMPLANT
PATTIES SURGICAL .5 X1 (DISPOSABLE) IMPLANT
PATTIES SURGICAL 1X1 (DISPOSABLE) ×1 IMPLANT
PUTTY 5ML ACTIFUSE ABX (Putty) ×1 IMPLANT
PUTTY ABX ACTIFUSE 20ML (Putty) ×1 IMPLANT
ROD CURVED REVERE 6.35X90MM (Rod) ×2 IMPLANT
SCREW 7.5X45MM (Screw) ×2 IMPLANT
SCREW 7.5X50MM (Screw) ×4 IMPLANT
SCREW REVERE 6.35 75X55MM (Screw) ×2 IMPLANT
SPACER SUSTAIN O 10X26 11MM (Peek) ×4 IMPLANT
SPACER SUSTAIN O 10X26 12MM (Spacer) ×2 IMPLANT
SPONGE GAUZE 4X4 12PLY (GAUZE/BANDAGES/DRESSINGS) ×2 IMPLANT
SPONGE LAP 4X18 X RAY DECT (DISPOSABLE) ×1 IMPLANT
SPONGE NEURO XRAY DETECT 1X3 (DISPOSABLE) IMPLANT
SPONGE SURGIFOAM ABS GEL 100 (HEMOSTASIS) ×3 IMPLANT
STRIP CLOSURE SKIN 1/2X4 (GAUZE/BANDAGES/DRESSINGS) ×2 IMPLANT
SUT VIC AB 1 CT1 18XBRD ANBCTR (SUTURE) ×2 IMPLANT
SUT VIC AB 1 CT1 8-18 (SUTURE) ×4
SUT VIC AB 2-0 CP2 18 (SUTURE) ×4 IMPLANT
SYR 20CC LL (SYRINGE) ×1 IMPLANT
SYR 20ML ECCENTRIC (SYRINGE) ×2 IMPLANT
TAPE CLOTH SURG 4X10 WHT LF (GAUZE/BANDAGES/DRESSINGS) ×1 IMPLANT
TOWEL OR 17X24 6PK STRL BLUE (TOWEL DISPOSABLE) ×3 IMPLANT
TOWEL OR 17X26 10 PK STRL BLUE (TOWEL DISPOSABLE) ×2 IMPLANT
TRAY FOLEY CATH 14FRSI W/METER (CATHETERS) ×2 IMPLANT
WATER STERILE IRR 1000ML POUR (IV SOLUTION) ×2 IMPLANT

## 2013-03-17 NOTE — Progress Notes (Signed)
Patient ID: Dawn Gross, female   DOB: Feb 22, 1951, 62 y.o.   MRN: [id number] Subjective:  The patient is somnolent but easily arousable. She is in no apparent distress.  Objective: Vital signs in last 24 hours: Temp:  [97.7 F (36.5 C)-97.9 F (36.6 C)] 97.9 F (36.6 C) (11/06 1537) Pulse Rate:  [62] 62 (11/06 0626) Resp:  [20] 20 (11/06 0626) BP: (128)/(71) 128/71 mmHg (11/06 0626) SpO2:  [100 %] 100 % (11/06 0626)  Intake/Output from previous day:   Intake/Output this shift: Total I/O In: 3175 [I.V.:2925; IV Piggyback:250] Out: 570 [Urine:270; Blood:300]  Physical exam patient is somnolent but easily arousable. She is moving all 4 extremities well.  Lab Results: No results found for this basename: WBC, HGB, HCT, PLT,  in the last 72 hours BMET No results found for this basename: NA, K, CL, CO2, GLUCOSE, BUN, CREATININE, CALCIUM,  in the last 72 hours  Studies/Results: Dg Lumbar Spine 2-3 Views  03/17/2013   CLINICAL DATA:  Intraoperative localization and spot films.  EXAM: LUMBAR SPINE - 1 VIEW; LUMBAR SPINE - 2-3 VIEW; DG C-ARM 61-120 MIN  COMPARISON:  Lumbar spine MRI 02/03/2013.  FINDINGS: Lateral lumbar spine film from the operating room labeled #1 demonstrates surgical instrument marking the L3-4 disc space level.  Spot films demonstrate pedicle screws and interbody bone spacers in good position without complicating features from L3-S1  IMPRESSION: L3-S1 fusion hardware without complicating features.   Electronically Signed   By: Loralie Champagne M.D.   On: 03/17/2013 15:03   Dg Lumbar Spine 1 View  03/17/2013   CLINICAL DATA:  Intraoperative localization and spot films.  EXAM: LUMBAR SPINE - 1 VIEW; LUMBAR SPINE - 2-3 VIEW; DG C-ARM 61-120 MIN  COMPARISON:  Lumbar spine MRI 02/03/2013.  FINDINGS: Lateral lumbar spine film from the operating room labeled #1 demonstrates surgical instrument marking the L3-4 disc space level.  Spot films demonstrate pedicle screws and  interbody bone spacers in good position without complicating features from L3-S1  IMPRESSION: L3-S1 fusion hardware without complicating features.   Electronically Signed   By: Loralie Champagne M.D.   On: 03/17/2013 15:03   Dg C-arm 61-120 Min  03/17/2013   CLINICAL DATA:  Intraoperative localization and spot films.  EXAM: LUMBAR SPINE - 1 VIEW; LUMBAR SPINE - 2-3 VIEW; DG C-ARM 61-120 MIN  COMPARISON:  Lumbar spine MRI 02/03/2013.  FINDINGS: Lateral lumbar spine film from the operating room labeled #1 demonstrates surgical instrument marking the L3-4 disc space level.  Spot films demonstrate pedicle screws and interbody bone spacers in good position without complicating features from L3-S1  IMPRESSION: L3-S1 fusion hardware without complicating features.   Electronically Signed   By: Loralie Champagne M.D.   On: 03/17/2013 15:03    Assessment/Plan: Patient is doing well. I spoke with her family.  LOS: 0 days     Enoch Moffa D 03/17/2013, 3:55 PM

## 2013-03-17 NOTE — Transfer of Care (Signed)
Immediate Anesthesia Transfer of Care Note  Patient: Dawn Gross  Procedure(s) Performed: Procedure(s) with comments: Lumbar three-four, lumbar four-five, lumbar five-sacral one laminectomies with posterior lumbar interbody fusion interbody prosthesis posterior lateral arthrodesis and posterior segemental instrumentation (N/A) - Lumbar three-four, lumbar four-five, lumbar five-sacral one laminectomies with posterior lumbar interbody fusion interbody prosthesis posterior lateral arthrodesis and posterior segemental instrumentation  Patient Location: PACU  Anesthesia Type:General  Level of Consciousness: awake and alert   Airway & Oxygen Therapy: Patient Spontanous Breathing and Patient connected to face mask oxygen  Post-op Assessment: Report given to PACU RN, Post -op Vital signs reviewed and stable and Patient moving all extremities X 4  Post vital signs: Reviewed and stable  Complications: No apparent anesthesia complications

## 2013-03-17 NOTE — Preoperative (Signed)
Beta Blockers   Reason not to administer Beta Blockers:Not Applicable 

## 2013-03-17 NOTE — Progress Notes (Signed)
Full dose Morphine PCA verified by Grandville Silos RN

## 2013-03-17 NOTE — Anesthesia Preprocedure Evaluation (Addendum)
Anesthesia Evaluation  Patient identified by MRN, date of birth, ID band Patient awake    Reviewed: Allergy & Precautions, H&P , NPO status , Patient's Chart, lab work & pertinent test results  Airway Mallampati: I  Neck ROM: Full    Dental   Pulmonary  breath sounds clear to auscultation        Cardiovascular hypertension, Rhythm:Regular Rate:Normal     Neuro/Psych    GI/Hepatic negative GI ROS, Neg liver ROS,   Endo/Other  negative endocrine ROSMorbid obesity  Renal/GU negative Renal ROS     Musculoskeletal   Abdominal (+) + obese,   Peds  Hematology negative hematology ROS (+)   Anesthesia Other Findings   Reproductive/Obstetrics                          Anesthesia Physical Anesthesia Plan  ASA: II  Anesthesia Plan: General   Post-op Pain Management:    Induction: Intravenous  Airway Management Planned: Oral ETT  Additional Equipment:   Intra-op Plan:   Post-operative Plan: Extubation in OR  Informed Consent: I have reviewed the patients History and Physical, chart, labs and discussed the procedure including the risks, benefits and alternatives for the proposed anesthesia with the patient or authorized representative who has indicated his/her understanding and acceptance.   Dental advisory given  Plan Discussed with:   Anesthesia Plan Comments:         Anesthesia Quick Evaluation

## 2013-03-17 NOTE — H&P (Signed)
Subjective: The patient is a 61 year old black female who has complained of back, hip and leg pain consistent with neurogenic claudication. She has failed medical management and was worked up with a lumbar x-rays and a lumbar MRI. This demonstrated this degeneration, spondylolisthesis, stenosis, etc. at L3-4, L4-5 and L5-S1. I discussed the various treatment option with the patient including surgery. She has weighed the risks, benefits, and alternatives surgery and decided proceed with a three-level lumbar decompression, instrumentation, and fusion.   Past Medical History  Diagnosis Date  . Hypertension   . Arthritis     Past Surgical History  Procedure Laterality Date  . Cholecystectomy    . Abdominal hysterectomy    . Joint replacement Bilateral     knees    No Known Allergies  History  Substance Use Topics  . Smoking status: Former Smoker -- 0.25 packs/day for 4 years    Types: Cigarettes    Quit date: 03/10/1979  . Smokeless tobacco: Not on file  . Alcohol Use: No    History reviewed. No pertinent family history. Prior to Admission medications   Medication Sig Start Date End Date Taking? Authorizing Provider  atorvastatin (LIPITOR) 20 MG tablet Take 20 mg by mouth daily.   Yes Historical Provider, MD  diclofenac (VOLTAREN) 75 MG EC tablet Take 75 mg by mouth 2 (two) times daily.   Yes Historical Provider, MD  ergocalciferol (VITAMIN D2) 50000 UNITS capsule Take 50,000 Units by mouth once a week. Saturday.   Yes Historical Provider, MD  furosemide (LASIX) 20 MG tablet Take 20 mg by mouth daily as needed for edema.   Yes Historical Provider, MD  HYDROcodone-acetaminophen (NORCO/VICODIN) 5-325 MG per tablet Take 1 tablet by mouth 2 (two) times daily as needed for pain.   Yes Historical Provider, MD  ibuprofen (ADVIL,MOTRIN) 200 MG tablet Take 400 mg by mouth every 6 (six) hours as needed for pain.   Yes Historical Provider, MD  lisinopril-hydrochlorothiazide (PRINZIDE,ZESTORETIC)  20-12.5 MG per tablet Take 1 tablet by mouth daily.   Yes Historical Provider, MD  Omega-3 Fatty Acids (FISH OIL) 600 MG CAPS Take 600 mg by mouth daily.   Yes Historical Provider, MD  potassium chloride (K-DUR) 10 MEQ tablet Take 10 mEq by mouth daily as needed (edema). Take with lasix   Yes Historical Provider, MD     Review of Systems  Positive ROS: As above  All other systems have been reviewed and were otherwise negative with the exception of those mentioned in the HPI and as above.  Objective: Vital signs in last 24 hours: Temp:  [97.7 F (36.5 C)] 97.7 F (36.5 C) (11/06 0626) Pulse Rate:  [62] 62 (11/06 0626) Resp:  [20] 20 (11/06 0626) BP: (128)/(71) 128/71 mmHg (11/06 0626) SpO2:  [100 %] 100 % (11/06 0626)  General Appearance: Alert, cooperative, no distress, appears stated age Head: Normocephalic, without obvious abnormality, atraumatic Eyes: PERRL, conjunctiva/corneas clear, EOM's intact, fundi benign, both eyes      Ears: Normal TM's and external ear canals, both ears Throat: Lips, mucosa, and tongue normal; teeth and gums normal Neck: Supple, symmetrical, trachea midline, no adenopathy; thyroid: No enlargement/tenderness/nodules; no carotid bruit or JVD Back: Symmetric, no curvature, ROM normal, no CVA tenderness Lungs: Clear to auscultation bilaterally, respirations unlabored Heart: Regular rate and rhythm, S1 and S2 normal, no murmur, rub or gallop Abdomen: Soft, non-tender, bowel sounds active all four quadrants, no masses, no organomegaly Extremities: Extremities normal, atraumatic, no cyanosis or edema Pulses: 2+  and symmetric all extremities Skin: Skin color, texture, turgor normal, no rashes or lesions  NEUROLOGIC:   Mental status: alert and oriented, no aphasia, good attention span, Fund of knowledge/ memory ok Motor Exam - grossly normal Sensory Exam - grossly normal Reflexes:  Coordination - grossly normal Gait - grossly normal Balance - grossly  normal Cranial Nerves: I: smell Not tested  II: visual acuity  OS: Normal    OD: Normal   II: visual fields Full to confrontation  II: pupils Equal, round, reactive to light  III,VII: ptosis None  III,IV,VI: extraocular muscles  Full ROM  V: mastication Normal  V: facial light touch sensation  Normal  V,VII: corneal reflex  Present  VII: facial muscle function - upper  Normal  VII: facial muscle function - lower Normal  VIII: hearing Not tested  IX: soft palate elevation  Normal  IX,X: gag reflex Present  XI: trapezius strength  5/5  XI: sternocleidomastoid strength 5/5  XI: neck flexion strength  5/5  XII: tongue strength  Normal    Data Review Lab Results  Component Value Date   WBC 5.8 03/09/2013   HGB 11.5* 03/09/2013   HCT 35.9* 03/09/2013   MCV 89.1 03/09/2013   PLT 237 03/09/2013   Lab Results  Component Value Date   NA 142 03/09/2013   K 3.3* 03/09/2013   CL 104 03/09/2013   CO2 30 03/09/2013   BUN 19 03/09/2013   CREATININE 0.70 03/09/2013   GLUCOSE 89 03/09/2013   No results found for this basename: INR, PROTIME    Assessment/Plan: L3-4, 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 I have reviewed her imaging studies with her and pointed out the abnormalities. We have discussed the various treatment options including surgery. I described the surgical treatment option of an L3-4, L4-5 and L5-S1 decompression, instrumentation, and fusion. I've shown her surgical models. We have discussed the risks, benefits, alternatives, and likelihood of achieving our goals with surgery. I've answered all the patient's questions. She has decided to proceed with surgery.   Deontaye Civello D 03/17/2013 8:25 AM

## 2013-03-17 NOTE — Anesthesia Procedure Notes (Signed)
Procedure Name: Intubation Date/Time: 03/17/2013 8:38 AM Performed by: Margaree Mackintosh Pre-anesthesia Checklist: Patient identified, Timeout performed, Emergency Drugs available, Suction available and Patient being monitored Patient Re-evaluated:Patient Re-evaluated prior to inductionOxygen Delivery Method: Circle system utilized Preoxygenation: Pre-oxygenation with 100% oxygen Intubation Type: IV induction Ventilation: Mask ventilation without difficulty and Oral airway inserted - appropriate to patient size Laryngoscope Size: Mac and 3 Grade View: Grade I Tube type: Oral Tube size: 7.0 mm Number of attempts: 1 Airway Equipment and Method: Stylet Placement Confirmation: ETT inserted through vocal cords under direct vision,  positive ETCO2 and breath sounds checked- equal and bilateral Secured at: 20 cm Tube secured with: Tape Dental Injury: Teeth and Oropharynx as per pre-operative assessment

## 2013-03-17 NOTE — Op Note (Signed)
Brief history: The patient is a 62 year old black female who has had chronic back buttocks and leg pain consistent with neurogenic claudication. She has failed medical management and was worked up with a lumbar MRI and lumbar x-rays. This demonstrated spinal stenosis, spondylolisthesis, etc. at L3-4, L4-5 and L5-S1. I discussed the various treatment option with the patient including surgery. She has weighed the risks, benefits, and alternatives surgery and decided proceed with a three-level lumbar decompression, instrumentation, and fusion.  Preoperative diagnosis: L3-4, L4-5 and L5-S1 spondylolisthesis, Degenerative disc disease, spinal stenosis compressing the bilateral L3, L4, L5 and S1 nerve roots; lumbago; lumbar radiculopathy  Postoperative diagnosis: The same  Procedure: L4 and L5 laminectomy with bilateral L2 Laminotomy/foraminotomies to decompress the bilateral L3, L4, L5 and S1 nerve roots(the work required to do this was in addition to the work required to do the posterior lumbar interbody fusion because of the patient's spinal stenosis, facet arthropathy. Etc. requiring a wide decompression of the nerve roots.); L3-4, L4-5 and L5-S1 posterior lumbar interbody fusion with local morselized autograft bone and Actifusebone graft extender; insertion of interbody prosthesis at L3-4, L4-5 and L5-S1 (globus peek interbody prosthesis); posterior segmental instrumentation from L3 to S1 with globus titanium pedicle screws and rods; posterior lateral arthrodesis at L3-4, L4-5 and L5-S1 with local morselized autograft bone and Vitoss bone graft extender.  Surgeon: Dr. Delma Officer  Asst.: Dr. Colon Branch  Anesthesia: Gen. endotracheal  Estimated blood loss: 300 cc  Drains: One medium Hemovac  Complications: None  Description of procedure: The patient was brought to the operating room by the anesthesia team. General endotracheal anesthesia was induced. The patient was turned to the prone position  on the Wilson frame. The patient's lumbosacral region was then prepared with Betadine scrub and Betadine solution. Sterile drapes were applied.  I then injected the area to be incised with Marcaine with epinephrine solution. I then used the scalpel to make a linear midline incision over the L2-3, L3-4, L4-5 and L5-S1 interspace. I then used electrocautery to perform a bilateral subperiosteal dissection exposing the spinous process and lamina of L2-S1. We then obtained intraoperative radiograph to confirm our location. We then inserted the Verstrac retractor to provide exposure. I incised interspinous ligament at L3-4, L4-5 and L5-S1. I used Leksell nodule were to remove the spinous process of L4 and L5 and the caudal aspect of the L3 spinous process.  I began the decompression by using the high speed drill to perform laminotomies at L3, L4 and L5. We then used the Kerrison punches to widen the laminotomy and removed the ligamentum flavum at L3-4, L4-5 and L5-S1. We used the Kerrison punches to remove the medial facets at L3-4, L4-5 and L5-S1. We performed wide foraminotomies about the bilateral L3, L4, L5 and S1 nerve roots completing the decompression.  We now turned our attention to the posterior lumbar interbody fusion. I used a scalpel to incise the intervertebral disc at L3-4, L4-5 and L5-S1. I then performed a partial intervertebral discectomy at L3-4, L4-5 and L5-S1 using the pituitary forceps. We prepared the vertebral endplates at L3-4, L4-5 and L5-S1 for the fusion by removing the soft tissues with the curettes. We then used the trial spacers to pick the appropriate sized interbody prosthesis. We prefilled his prosthesis with a combination of local morselized autograft bone that we obtained during the decompression as well as Actifuse bone graft extender. We inserted the prefilled prosthesis into the interspace at L3-4, L4-5 and L5-S1. There was a good snug  fit of the prosthesis in the interspace. We  then filled and the remainder of the intervertebral disc space with local morselized autograft bone and Actifuse. This completed the posterior lumbar interbody arthrodesis.  We now turned attention to the instrumentation. Under fluoroscopic guidance we cannulated the bilateral L3, L4, L5 and S1 pedicles with the bone probe. We then removed the bone probe. We then tapped the pedicle with a 6.5 millimeter tap. We then removed the tap. We probed inside the tapped pedicle with a ball probe to rule out cortical breaches. We then inserted a 7.5 x 45, 50 and 55 millimeter pedicle screw into the L3, L4, L5 and S1 pedicles bilaterally under fluoroscopic guidance. We then palpated along the medial aspect of the pedicles to rule out cortical breaches. There were none. The nerve roots were not injured. We then connected the unilateral pedicle screws with a lordotic rod. We compressed the construct and secured the rod in place with the caps. We then tightened the caps appropriately.  Placed a cross connector between the rods and tightened this appropriately.This completed the instrumentation from L3-S1.  We now turned our attention to the posterior lateral arthrodesis at L3-4, L4-5 and L5-S1. We used the high-speed drill to decorticate the remainder of the facets, pars, transverse process at L3-4, L4-5 and L5-S1. We then applied a combination of local morselized autograft bone and Vitoss bone graft extender over these decorticated posterior lateral structures. This completed the posterior lateral arthrodesis.  We then obtained hemostasis using bipolar electrocautery. We irrigated the wound out with bacitracin solution. We inspected the thecal sac and nerve roots and noted they were well decompressed. We then removed the retractor. We placed a medium Hemovac drain in the epidural space and tunneled out through separate stab wound. We reapproximated patient's thoracolumbar fascia with interrupted #1 Vicryl suture. We  reapproximated patient's subcutaneous tissue with interrupted 2-0 Vicryl suture. The reapproximated patient's skin with Steri-Strips and benzoin. The wound was then coated with bacitracin ointment. A sterile dressing was applied. The drapes were removed. The patient was subsequently returned to the supine position where they were extubated by the anesthesia team. He was then transported to the post anesthesia care unit in stable condition. All sponge instrument and needle counts were reportedly correct at the end of this case.

## 2013-03-17 NOTE — Anesthesia Postprocedure Evaluation (Signed)
  Anesthesia Post-op Note  Patient: Dawn Gross  Procedure(s) Performed: Procedure(s) with comments: Lumbar three-four, lumbar four-five, lumbar five-sacral one laminectomies with posterior lumbar interbody fusion interbody prosthesis posterior lateral arthrodesis and posterior segemental instrumentation (N/A) - Lumbar three-four, lumbar four-five, lumbar five-sacral one laminectomies with posterior lumbar interbody fusion interbody prosthesis posterior lateral arthrodesis and posterior segemental instrumentation  Patient Location: PACU  Anesthesia Type:General  Level of Consciousness: awake and alert   Airway and Oxygen Therapy: Patient Spontanous Breathing  Post-op Pain: mild  Post-op Assessment: Post-op Vital signs reviewed  Post-op Vital Signs: stable  Complications: No apparent anesthesia complications

## 2013-03-18 LAB — BASIC METABOLIC PANEL
BUN: 18 mg/dL (ref 6–23)
CO2: 28 mEq/L (ref 19–32)
Chloride: 101 mEq/L (ref 96–112)
GFR calc Af Amer: >90 mL/min (ref >90)
GFR calc non Af Amer: 89 mL/min — ABNORMAL LOW (ref >90)
Potassium: 3.3 mEq/L — ABNORMAL LOW (ref 3.5–5.1)
Sodium: 136 mEq/L (ref 135–145)

## 2013-03-18 LAB — CBC
HCT: 27.8 % — ABNORMAL LOW (ref 36.0–46.0)
MCHC: 33.1 g/dL (ref 30.0–36.0)
RDW: 15.2 % (ref 11.5–15.5)
WBC: 9.8 10*3/uL (ref 4.0–10.5)

## 2013-03-18 NOTE — Evaluation (Signed)
Physical Therapy Evaluation Patient Details Name: Dawn Gross MRN: [id number] DOB: 12-06-1950 Today's Date: 03/18/2013 Time: 4098-1191 PT Time Calculation (min): 26 min  PT Assessment / Plan / Recommendation History of Present Illness  Pt is a pleasent 62 y.o. female s/p multiple lumbar laminectomy with posterior lumbar fusion.  Clinical Impression  Patient evaluation limited secondary to no brace available at this time. Performed EOB dangle with patient. Patient educated extensively regarding back precautions. Will follow up for further assessment upon arrival of brace.    PT Assessment  Patient needs continued PT services    Follow Up Recommendations   (TBD)       Barriers to Discharge Decreased caregiver support lives alone    Equipment Recommendations   (TBD)       Frequency Min 5X/week    Precautions / Restrictions Precautions Precautions: Back Precaution Booklet Issued: Yes (comment) Precaution Comments: Verbally educated with teach back of back precautions   Pertinent Vitals/Pain 7/10      Mobility  Bed Mobility Bed Mobility: Rolling Right;Rolling Left;Right Sidelying to Sit;Sitting - Scoot to [place] of Bed;Sit to Sidelying Right Rolling Right: 4: Min guard Rolling Left: 4: Min guard Right Sidelying to Sit: 4: Min assist Sitting - Scoot to [place] of Bed: 4: Min guard Sit to Sidelying Right: 4: Min assist Details for Bed Mobility Assistance: VCs for technique with log roll method Transfers Transfers: Not assessed Details for Transfer Assistance: awaiting back brace        PT Diagnosis: Acute pain  PT Problem List: Decreased strength;Decreased range of motion;Decreased activity tolerance;Decreased balance;Decreased mobility;Pain PT Treatment Interventions: DME instruction;Gait training;Stair training;Functional mobility training;Therapeutic activities;Therapeutic exercise;Patient/family education     PT Goals(Current goals can be found in the care plan  section) Acute Rehab PT Goals Patient Stated Goal: to go home PT Goal Formulation: With patient Time For Goal Achievement: 04/01/13 Potential to Achieve Goals: Good  Visit Information  Last PT Received On: 03/18/13 History of Present Illness: Pt is a pleasent 62 y.o. female s/p multiple lumbar laminectomy with posterior lumbar fusion.       Prior Functioning  Home Living Family/patient expects to be discharged to:: Private residence Living Arrangements: Alone Available Help at Discharge: Family Type of Home: House Home Access: Stairs to enter Secretary/administrator of Steps: 1 Entrance Stairs-Rails: None Home Layout: One level Home Equipment: Environmental consultant - 2 wheels;Cane - single point;Shower seat Prior Function Level of Independence: Independent Communication Communication: No difficulties Dominant Hand: Right    Cognition  Cognition Arousal/Alertness: Awake/alert Behavior During Therapy: WFL for tasks assessed/performed Overall Cognitive Status: Within Functional Limits for tasks assessed    Extremity/Trunk Assessment Upper Extremity Assessment Upper Extremity Assessment: Defer to OT evaluation Lower Extremity Assessment Lower Extremity Assessment: LLE deficits/detail   Balance Balance Balance Assessed: Yes Static Sitting Balance Static Sitting - Balance Support: Feet supported Static Sitting - Level of Assistance: 5: Stand by assistance Static Sitting - Comment/# of Minutes: 6 minutes at EOB to tangle, no dizziness or nausea reported  End of Session PT - End of Session Equipment Utilized During Treatment: Oxygen Activity Tolerance: Patient limited by pain Patient left: in bed;with call bell/phone within reach;with bed alarm set Nurse Communication: Mobility status;Other (comment) (request for back brace)  GP     Fabio Asa 03/18/2013, 8:53 AM Charlotte Crumb, PT DPT  [phone]

## 2013-03-18 NOTE — Progress Notes (Signed)
Patient was bladder scan after unsuccessful voiding by herself, Only 250 was obtained after she was straight cath. Will continue to monitor and pass it on to the incoming staff.

## 2013-03-18 NOTE — Progress Notes (Signed)
Patient ID: [name], female   DOB: 08-22-1950, 62 y.o.   MRN: [id number] Subjective:  The patient is alert and pleasant. She looks well. Her back is appropriately sore.  Objective: Vital signs in last 24 hours: Temp:  [97.6 F (36.4 C)-98.8 F (37.1 C)] 98.5 F (36.9 C) (11/07 0621) Pulse Rate:  [63-115] 101 (11/07 0621) Resp:  [10-23] 20 (11/07 0738) BP: (107-135)/(59-75) 112/59 mmHg (11/07 0621) SpO2:  [89 %-100 %] 99 % (11/07 0738) FiO2 (%):  [2 %-45 %] 45 % (11/07 0738)  Intake/Output from previous day: 11/06 0701 - 11/07 0700 In: 4325 [P.O.:50; I.V.:3925; IV Piggyback:350] Out: 1695 [Urine:880; Drains:515; Blood:300] Intake/Output this shift:    Physical exam patient is alert and oriented. She is moving her lower extremities well.  Lab Results:  Recent Labs  03/18/13 0515  WBC 9.8  HGB 9.2*  HCT 27.8*  PLT 196   BMET  Recent Labs  03/18/13 0515  NA 136  K 3.3*  CL 101  CO2 28  GLUCOSE 110*  BUN 18  CREATININE 0.74  CALCIUM 8.5    Studies/Results: Dg Lumbar Spine 2-3 Views  03/17/2013   CLINICAL DATA:  Intraoperative localization and spot films.  EXAM: LUMBAR SPINE - 1 VIEW; LUMBAR SPINE - 2-3 VIEW; DG C-ARM 61-120 MIN  COMPARISON:  Lumbar spine MRI 02/03/2013.  FINDINGS: Lateral lumbar spine film from the operating room labeled #1 demonstrates surgical instrument marking the L3-4 disc space level.  Spot films demonstrate pedicle screws and interbody bone spacers in good position without complicating features from L3-S1  IMPRESSION: L3-S1 fusion hardware without complicating features.   Electronically Signed   By: Loralie Champagne M.D.   On: 03/17/2013 15:03   Dg Lumbar Spine 1 View  03/17/2013   CLINICAL DATA:  Intraoperative localization and spot films.  EXAM: LUMBAR SPINE - 1 VIEW; LUMBAR SPINE - 2-3 VIEW; DG C-ARM 61-120 MIN  COMPARISON:  Lumbar spine MRI 02/03/2013.  FINDINGS: Lateral lumbar spine film from the operating room labeled #1  demonstrates surgical instrument marking the L3-4 disc space level.  Spot films demonstrate pedicle screws and interbody bone spacers in good position without complicating features from L3-S1  IMPRESSION: L3-S1 fusion hardware without complicating features.   Electronically Signed   By: Loralie Champagne M.D.   On: 03/17/2013 15:03   Dg C-arm 61-120 Min  03/17/2013   CLINICAL DATA:  Intraoperative localization and spot films.  EXAM: LUMBAR SPINE - 1 VIEW; LUMBAR SPINE - 2-3 VIEW; DG C-ARM 61-120 MIN  COMPARISON:  Lumbar spine MRI 02/03/2013.  FINDINGS: Lateral lumbar spine film from the operating room labeled #1 demonstrates surgical instrument marking the L3-4 disc space level.  Spot films demonstrate pedicle screws and interbody bone spacers in good position without complicating features from L3-S1  IMPRESSION: L3-S1 fusion hardware without complicating features.   Electronically Signed   By: Loralie Champagne M.D.   On: 03/17/2013 15:03    Assessment/Plan: Postop day #1: The patient is doing well. We will continue her PCA until tomorrow. I will also continue her Hemovac until tomorrow as she's had quite a bit of drainage throughout the evening. We will mobilize her with PT and OT. She will likely go home this weekend. I gave her her discharge instructions and answered all her questions.  LOS: 1 day     Anihya Tuma D 03/18/2013, 7:49 AM

## 2013-03-18 NOTE — Evaluation (Signed)
Occupational Therapy Evaluation Patient Details Name: Dawn Gross MRN: [id number] DOB: 20-Nov-1950 Today's Date: 03/18/2013 Time: 4098-1191 OT Time Calculation (min): 23 min  OT Assessment / Plan / Recommendation History of present illness Pt is a pleasent 62 y.o. female s/p multiple lumbar laminectomy with posterior lumbar fusion.   Clinical Impression   Pt demos decline un function with ADLs and ADL mobility safety and would benefit from acute OT services to address impairments to help restore PLOF to return home safely. Pt lives at home alone and states that she will have help coming in assist her initially    OT Assessment  Patient needs continued OT Services    Follow Up Recommendations  Home health OT;Supervision/Assistance - 24 hour    Barriers to Discharge Decreased caregiver support pt lives at home alone, but states tht she will have someone to come in and help her initially   Equipment Recommendations    ADL A/E kit    Recommendations for Other Services    Frequency  Min 2X/week    Precautions / Restrictions Precautions Precautions: Back Precaution Booklet Issued: Yes (comment) Precaution Comments: pt able to recall 1/3 back precautions, reviewed all back precautions with pt Restrictions Weight Bearing Restrictions: No   Pertinent Vitals/Pain 6/10 back    ADL  Grooming: Performed;Wash/dry hands;Wash/dry face;Minimal assistance;Min guard Where Assessed - Grooming: Supported standing Upper Body Bathing: Simulated;Supervision/safety;Set up Lower Body Bathing: Simulated;Maximal assistance Upper Body Dressing: Performed;Supervision/safety;Set up;Minimal assistance;Other (comment) (min A to donn back brace) Where Assessed - Upper Body Dressing: Unsupported sitting Lower Body Dressing: +1 Total assistance Toilet Transfer: Performed;Min guard;Minimal assistance Toilet Transfer Method: Sit to stand Toilet Transfer Equipment: Bedside commode Toileting - Clothing  Manipulation and Hygiene: Performed;Moderate assistance Where Assessed - Toileting Clothing Manipulation and Hygiene: Standing Tub/Shower Transfer Method: Not assessed Transfers/Ambulation Related to ADLs: cues for correct hand placement ADL Comments: Pt familar with ADL A/E from previous ortho surgeries    OT Diagnosis: Generalized weakness  OT Problem List: Decreased knowledge of use of DME or AE;Decreased activity tolerance;Pain;Impaired balance (sitting and/or standing);Decreased knowledge of precautions OT Treatment Interventions: Self-care/ADL training;Therapeutic exercise;Patient/family education;Neuromuscular education;Balance training;Therapeutic activities;DME and/or AE instruction   OT Goals(Current goals can be found in the care plan section) Acute Rehab OT Goals Patient Stated Goal: to go home OT Goal Formulation: With patient Time For Goal Achievement: 03/25/13 Potential to Achieve Goals: Good ADL Goals Pt Will Perform Grooming: with min guard assist;with supervision;with set-up;standing Pt Will Perform Lower Body Bathing: with mod assist;with adaptive equipment Pt Will Perform Lower Body Dressing: with max assist;with mod assist;with adaptive equipment Pt Will Transfer to Toilet: with min guard assist;grab bars;ambulating;regular height toilet (3 in 1 over toilet) Pt Will Perform Toileting - Clothing Manipulation and hygiene: with min assist;with adaptive equipment Pt Will Perform Tub/Shower Transfer: with min assist;with min guard assist;tub bench Additional ADL Goal #1: Pt will recall/verbalize 3/3 back precautions  Visit Information  Last OT Received On: 03/18/13 Assistance Needed: +1 History of Present Illness: Pt is a pleasent 62 y.o. female s/p multiple lumbar laminectomy with posterior lumbar fusion.       Prior Functioning     Home Living Family/patient expects to be discharged to:: Private residence Living Arrangements: Alone Available Help at Discharge:  Family Type of Home: House Home Access: Stairs to enter Secretary/administrator of Steps: 1 Entrance Stairs-Rails: None Home Layout: One level Home Equipment: Environmental consultant - 2 wheels;Cane - single point;Shower seat;Tub bench Additional Comments: Pt states that has has  a tub bench when shown a picture  as she was not sure initially if OT was educating her on the same DME she already has at home Prior Function Level of Independence: Independent Communication Communication: No difficulties Dominant Hand: Right         Vision/Perception Vision - History Baseline Vision: Wears glasses only for reading Patient Visual Report: No change from baseline Perception Perception: Within Functional Limits   Cognition  Cognition Arousal/Alertness: Awake/alert Behavior During Therapy: WFL for tasks assessed/performed Overall Cognitive Status: Within Functional Limits for tasks assessed    Extremity/Trunk Assessment Upper Extremity Assessment Upper Extremity Assessment: Overall WFL for tasks assessed Lower Extremity Assessment Lower Extremity Assessment: Defer to PT evaluation Cervical / Trunk Assessment Cervical / Trunk Assessment: Normal     Mobility Bed Mobility Bed Mobility: Rolling Right;Rolling Left;Right Sidelying to Sit;Sitting - Scoot to [place] of Bed;Sit to Sidelying Right Rolling Right: 4: Min guard Rolling Left: 4: Min guard Right Sidelying to Sit: 4: Min assist Sitting - Scoot to [place] of Bed: 4: Min guard Sit to Sidelying Right: 4: Min assist Details for Bed Mobility Assistance: VCs for technique with log roll method Transfers Sit to Stand: 4: Min assist;From bed;From chair/3-in-1 Stand to Sit: 4: Min assist;To toilet;To bed;To chair/3-in-1 Details for Transfer Assistance: VCs for correct hand placement     Exercise     Balance Balance Balance Assessed: Yes Dynamic Sitting Balance Dynamic Sitting - Balance Support: No upper extremity supported;Feet supported;During  functional activity Dynamic Sitting - Level of Assistance: 5: Stand by assistance Dynamic Standing Balance Dynamic Standing - Balance Support: Left upper extremity supported;During functional activity Dynamic Standing - Level of Assistance: 4: Min assist   End of Session OT - End of Session Equipment Utilized During Treatment: Gait belt;Rolling walker;Other (comment) (BSC) Activity Tolerance: Patient tolerated treatment well Patient left: in bed;with call bell/phone within reach  GO     Galen Manila 03/18/2013, 2:47 PM

## 2013-03-18 NOTE — Progress Notes (Signed)
Patient voided about 80 cc at 1100 am, output not great. She has been encouraged to drink a lot of fluids. Will continue to monitor her.

## 2013-03-18 NOTE — Progress Notes (Signed)
UR COMPLETED  

## 2013-03-18 NOTE — Evaluation (Signed)
Physical Therapy Evaluation Patient Details Name: Dawn Gross MRN: [id number] DOB: 1950-07-19 Today's Date: 03/18/2013 Time: 4098-1191 PT Time Calculation (min): 19 min  PT Assessment / Plan / Recommendation History of Present Illness  Pt is a pleasent 62 y.o. female s/p multiple lumbar laminectomy with posterior lumbar fusion.  Clinical Impression  Patient demonstrates deficits in functional mobility as indicated below. Will benefit from continued skilled PT acutely. Recommend HHPT and family assist upon discharge. Will see as indicated and progress activity as tolerated.    PT Assessment  Patient needs continued PT services    Follow Up Recommendations  Home health PT;Supervision/Assistance - 24 hour    Does the patient have the potential to tolerate intense rehabilitation      Barriers to Discharge Decreased caregiver support lives alone    Equipment Recommendations  None recommended by PT    Recommendations for Other Services     Frequency Min 5X/week    Precautions / Restrictions Precautions Precautions: Back Precaution Booklet Issued: Yes (comment) Precaution Comments: Verbally educated with teach back of back precautions   Pertinent Vitals/Pain 6/10       Mobility  Bed Mobility Bed Mobility: Rolling Right;Rolling Left;Right Sidelying to Sit;Sitting - Scoot to [place] of Bed;Sit to Sidelying Right Rolling Right: 4: Min guard Rolling Left: 4: Min guard Right Sidelying to Sit: 4: Min assist Sitting - Scoot to [place] of Bed: 4: Min guard Sit to Sidelying Right: 4: Min assist Details for Bed Mobility Assistance: VCs for technique with log roll method Transfers Transfers: Sit to Stand;Stand to Sit Sit to Stand: 4: Min assist;From bed;From toilet Stand to Sit: 4: Min assist;To chair/3-in-1;To toilet Details for Transfer Assistance: VCs for hand placement Ambulation/Gait Ambulation/Gait Assistance: 4: Min guard Ambulation Distance (Feet): 24 Feet Assistive  device: Rolling walker Ambulation/Gait Assistance Details:  (slow but steady with ambulation)        PT Diagnosis: Acute pain  PT Problem List: Decreased strength;Decreased range of motion;Decreased activity tolerance;Decreased balance;Decreased mobility;Pain PT Treatment Interventions: DME instruction;Gait training;Stair training;Functional mobility training;Therapeutic activities;Therapeutic exercise;Patient/family education     PT Goals(Current goals can be found in the care plan section) Acute Rehab PT Goals Patient Stated Goal: to go home PT Goal Formulation: With patient Time For Goal Achievement: 04/01/13 Potential to Achieve Goals: Good  Visit Information  Last PT Received On: 03/18/13 Assistance Needed: +1 History of Present Illness: Pt is a pleasent 62 y.o. female s/p multiple lumbar laminectomy with posterior lumbar fusion.       Prior Functioning  Home Living Family/patient expects to be discharged to:: Private residence Living Arrangements: Alone Available Help at Discharge: Family Type of Home: House Home Access: Stairs to enter Secretary/administrator of Steps: 1 Entrance Stairs-Rails: None Home Layout: One level Home Equipment: Environmental consultant - 2 wheels;Cane - single point;Shower seat Prior Function Level of Independence: Independent Communication Communication: No difficulties Dominant Hand: Right    Cognition  Cognition Arousal/Alertness: Awake/alert Behavior During Therapy: WFL for tasks assessed/performed Overall Cognitive Status: Within Functional Limits for tasks assessed    Extremity/Trunk Assessment Upper Extremity Assessment Upper Extremity Assessment: Defer to OT evaluation Lower Extremity Assessment Lower Extremity Assessment: LLE deficits/detail   Balance Balance Balance Assessed: Yes Static Sitting Balance Static Sitting - Balance Support: Feet supported Static Sitting - Level of Assistance: 5: Stand by assistance Static Sitting - Comment/#  of Minutes: 3 minutes at counter for hygiene  End of Session PT - End of Session Equipment Utilized During Treatment: Oxygen Activity Tolerance:  Patient limited by pain Patient left: in bed;with call bell/phone within reach;with bed alarm set Nurse Communication: Mobility status;Other (comment) (request for back brace)  GP     Fabio Asa 03/18/2013, 11:17 AM Charlotte Crumb, PT DPT  [phone]

## 2013-03-18 NOTE — Progress Notes (Signed)
OT Cancellation Note  Patient Details Name: Dawn Gross MRN: [id number] DOB: 05-09-1951   Cancelled Treatment:    Reason Eval/Treat Not Completed: Other (comment). Awaiting back brace, will re attempt later today  Galen Manila 03/18/2013, 10:14 AM

## 2013-03-18 NOTE — Progress Notes (Signed)
Orthopedic Tech Progress Note Patient Details:  Dawn Gross March 25, 1951 [id number]  Patient ID: [name], female   DOB: 1951/04/14, 62 y.o.   MRN: [id number]   [name] 03/18/2013, 9:23 AMCalled [employer] for aspen lumbar fusion brace.

## 2013-03-19 LAB — CBC
HCT: 26 % — ABNORMAL LOW (ref 36.0–46.0)
Hemoglobin: 8.5 g/dL — ABNORMAL LOW (ref 12.0–15.0)
MCV: 87.8 fL (ref 78.0–100.0)
Platelets: 173 10*3/uL (ref 150–400)
RBC: 2.96 MIL/uL — ABNORMAL LOW (ref 3.87–5.11)
RDW: 14.9 % (ref 11.5–15.5)
WBC: 8.6 10*3/uL (ref 4.0–10.5)

## 2013-03-19 NOTE — Progress Notes (Signed)
PCA pump discontinued, patient not using. Agreeable to d/c it.

## 2013-03-19 NOTE — Progress Notes (Signed)
Physical Therapy Treatment Patient Details Name: Dawn Gross MRN: [id number] DOB: May 15, 1950 Today's Date: 03/19/2013 Time: 5956-3875 PT Time Calculation (min): 8 min  PT Assessment / Plan / Recommendation  History of Present Illness Pt is a pleasent 63 y.o. female s/p multiple lumbar laminectomy with posterior lumbar fusion.   PT Comments   Pt moving well today.    Follow Up Recommendations  Home health PT;Supervision/Assistance - 24 hour     Does the patient have the potential to tolerate intense rehabilitation     Barriers to Discharge        Equipment Recommendations  None recommended by PT    Recommendations for Other Services    Frequency Min 5X/week   Progress towards PT Goals Progress towards PT goals: Progressing toward goals  Plan Current plan remains appropriate    Precautions / Restrictions Precautions Precautions: Back Precaution Comments: pt recalled 2/3 back precautions.  Reviewed precautions.   Required Braces or Orthoses: Spinal Brace Spinal Brace: Applied in sitting position Restrictions Weight Bearing Restrictions: No   Pertinent Vitals/Pain Indicated achey today.      Mobility  Bed Mobility Bed Mobility: Not assessed Transfers Transfers: Stand to Sit;Sit to Stand Sit to Stand: 4: Min guard;With upper extremity assist;From chair/3-in-1 Stand to Sit: 4: Min guard;With upper extremity assist;To chair/3-in-1;With armrests Details for Transfer Assistance: cues fro UE use.   Ambulation/Gait Ambulation/Gait Assistance: 4: Min guard Ambulation Distance (Feet): 70 Feet Assistive device: Rolling walker Ambulation/Gait Assistance Details: cues for upright posture and encouragement.   Gait Pattern: Step-through pattern;Decreased stride length Stairs: Yes Stairs Assistance: 4: Min assist Stairs Assistance Details (indicate cue type and reason): cues for safe stair technique.   Stair Management Technique: No rails;Backwards;With walker Number of  Stairs: 1 Wheelchair Mobility Wheelchair Mobility: No    Exercises     PT Diagnosis:    PT Problem List:   PT Treatment Interventions:     PT Goals (current goals can now be found in the care plan section) Acute Rehab PT Goals Patient Stated Goal: to go home Time For Goal Achievement: 04/01/13 Potential to Achieve Goals: Good  Visit Information  Last PT Received On: 03/19/13 Assistance Needed: +1 History of Present Illness: Pt is a pleasent 62 y.o. female s/p multiple lumbar laminectomy with posterior lumbar fusion.    Subjective Data  Patient Stated Goal: to go home   Cognition  Cognition Arousal/Alertness: Awake/alert Behavior During Therapy: WFL for tasks assessed/performed Overall Cognitive Status: Within Functional Limits for tasks assessed    Balance  Balance Balance Assessed: No  End of Session PT - End of Session Equipment Utilized During Treatment: Gait belt;Back brace Activity Tolerance: Patient tolerated treatment well Patient left: in chair;with call bell/phone within reach;with family/visitor present Nurse Communication: Mobility status   GP     Sunny Schlein, [place] [phone] 03/19/2013, 12:34 PM

## 2013-03-19 NOTE — Progress Notes (Signed)
Occupational Therapy Treatment Patient Details Name: Dawn Gross MRN: [id number] DOB: 1951/05/05 Today's Date: 03/19/2013 Time: 5784-6962 OT Time Calculation (min): 25 min  OT Assessment / Plan / Recommendation  History of present illness Pt is a pleasent 62 y.o. female s/p multiple lumbar laminectomy with posterior lumbar fusion.   OT comments  Practiced tub transfer, toileting, grooming, and AE for LB ADLs. OT provided education.   Follow Up Recommendations  Home health OT;Supervision/Assistance - 24 hour    Barriers to Discharge       Equipment Recommendations  3 in 1 bedside comode    Recommendations for Other Services    Frequency Min 2X/week   Progress towards OT Goals Progress towards OT goals: Progressing toward goals (goals updated)  Plan Discharge plan remains appropriate    Precautions / Restrictions Precautions Precautions: Back Precaution Comments: Reviewed back precautions with pt Required Braces or Orthoses: Spinal Brace Spinal Brace: Applied in sitting position Restrictions Weight Bearing Restrictions: No   Pertinent Vitals/Pain Pain not rated. Increased activity.     ADL  Grooming: Performed;Wash/dry hands;Supervision/safety Where Assessed - Grooming: Supported standing Upper Body Dressing: Minimal assistance Where Assessed - Upper Body Dressing: Unsupported sitting Lower Body Dressing: Min guard Where Assessed - Lower Body Dressing: Supported sit to Pharmacist, hospital: Hydrographic surveyor Method: Sit to Barista: Raised toilet seat with arms (or 3-in-1 over toilet) Toileting - Clothing Manipulation and Hygiene: Min guard Where Assessed - Toileting Clothing Manipulation and Hygiene: Sit to stand from 3-in-1 or toilet Tub/Shower Transfer: Performed;Moderate assistance Tub/Shower Transfer Method: Ambulating Tub/Shower Transfer Equipment: Counsellor Used: Gait belt;Rolling  walker;Reacher;Long-handled sponge;Long-handled shoe horn;Sock aid;Other (comment) (toilet aid) Transfers/Ambulation Related to ADLs: Min guard ADL Comments: Pt donned underwear and donned/doffed sock-cues for precautions. Educated on use of bag on walker to carry items. Pt performed hygiene but was breaking precautions-educated on toilet aid for hygiene. Educated to have grooming items on right side of sink and use two cups for teeth care to avoid breaking precautions.     OT Diagnosis:    OT Problem List:   OT Treatment Interventions:     OT Goals(current goals can now be found in the care plan section) Acute Rehab OT Goals Patient Stated Goal: not stated OT Goal Formulation: With patient Time For Goal Achievement: 03/25/13 Potential to Achieve Goals: Good ADL Goals Pt Will Perform Grooming: with set-up;standing Pt Will Perform Lower Body Bathing: with min guard assist;sit to/from stand;with adaptive equipment Pt Will Perform Lower Body Dressing: with min guard assist;with adaptive equipment;sit to/from stand Pt Will Transfer to Toilet: ambulating;with supervision (3 in 1 over commode) Pt Will Perform Toileting - Clothing Manipulation and hygiene: with min assist;with adaptive equipment Pt Will Perform Tub/Shower Transfer: with min assist;with min guard assist;tub bench Additional ADL Goal #1: Pt will recall/verbalize 3/3 back precautions  Visit Information  Last OT Received On: 03/19/13 Assistance Needed: +1 History of Present Illness: Pt is a pleasent 62 y.o. female s/p multiple lumbar laminectomy with posterior lumbar fusion.    Subjective Data      Prior Functioning       Cognition  Cognition Arousal/Alertness: Awake/alert Behavior During Therapy: WFL for tasks assessed/performed Overall Cognitive Status: Within Functional Limits for tasks assessed    Mobility  Bed Mobility Bed Mobility: Rolling Right Rolling Right: 4: Min guard Right Sidelying to Sit: 4: Min  assist Details for Bed Mobility Assistance: cues for technique. Transfers Transfers: Sit to Stand;Stand to  Sit Sit to Stand: 4: Min guard;With upper extremity assist;From bed;From chair/3-in-1 Stand to Sit: 4: Min guard;With upper extremity assist;To bed;To chair/3-in-1 Details for Transfer Assistance: Min guard for safety. Cues for technique.    Exercises         End of Session OT - End of Session Equipment Utilized During Treatment: Gait belt;Rolling walker;Back brace Activity Tolerance: Patient tolerated treatment well Patient left: Other (comment) (with PT)  GO     Earlie Raveling OTR/L [phone] 03/19/2013, 2:45 PM

## 2013-03-19 NOTE — Progress Notes (Signed)
Subjective: Patient reports Overall she's doing okay just feels weak all over the old is he went to get up onto a blade today but she says her legs feel much better than it did before surgery and her pain is well controlled.  Objective: Vital signs in last 24 hours: Temp:  [98.2 F (36.8 C)-100.3 F (37.9 C)] 98.2 F (36.8 C) (11/08 0528) Pulse Rate:  [74-106] 74 (11/08 0528) Resp:  [16-29] 20 (11/08 0528) BP: (89-135)/(46-64) 119/63 mmHg (11/08 0528) SpO2:  [97 %-100 %] 98 % (11/08 0528)  Intake/Output from previous day: 11/07 0701 - 11/08 0700 In: 600 [P.O.:600] Out: 1944 [Urine:1354; Drains:590] Intake/Output this shift:    Awake alert oriented strength out of 5 wound clean and dry drainage still significant approximately 340 cc of the last 8-10 hours so we'll continue her Hemovac canal. The drainage appears mostly serous  Lab Results:  Recent Labs  03/18/13 0515 03/19/13 0600  WBC 9.8 8.6  HGB 9.2* 8.5*  HCT 27.8* 26.0*  PLT 196 173   BMET  Recent Labs  03/18/13 0515  NA 136  K 3.3*  CL 101  CO2 28  GLUCOSE 110*  BUN 18  CREATININE 0.74  CALCIUM 8.5    Studies/Results: Dg Lumbar Spine 2-3 Views  03/17/2013   CLINICAL DATA:  Intraoperative localization and spot films.  EXAM: LUMBAR SPINE - 1 VIEW; LUMBAR SPINE - 2-3 VIEW; DG C-ARM 61-120 MIN  COMPARISON:  Lumbar spine MRI 02/03/2013.  FINDINGS: Lateral lumbar spine film from the operating room labeled #1 demonstrates surgical instrument marking the L3-4 disc space level.  Spot films demonstrate pedicle screws and interbody bone spacers in good position without complicating features from L3-S1  IMPRESSION: L3-S1 fusion hardware without complicating features.   Electronically Signed   By: Loralie Champagne M.D.   On: 03/17/2013 15:03   Dg Lumbar Spine 1 View  03/17/2013   CLINICAL DATA:  Intraoperative localization and spot films.  EXAM: LUMBAR SPINE - 1 VIEW; LUMBAR SPINE - 2-3 VIEW; DG C-ARM 61-120 MIN   COMPARISON:  Lumbar spine MRI 02/03/2013.  FINDINGS: Lateral lumbar spine film from the operating room labeled #1 demonstrates surgical instrument marking the L3-4 disc space level.  Spot films demonstrate pedicle screws and interbody bone spacers in good position without complicating features from L3-S1  IMPRESSION: L3-S1 fusion hardware without complicating features.   Electronically Signed   By: Loralie Champagne M.D.   On: 03/17/2013 15:03   Dg C-arm 61-120 Min  03/17/2013   CLINICAL DATA:  Intraoperative localization and spot films.  EXAM: LUMBAR SPINE - 1 VIEW; LUMBAR SPINE - 2-3 VIEW; DG C-ARM 61-120 MIN  COMPARISON:  Lumbar spine MRI 02/03/2013.  FINDINGS: Lateral lumbar spine film from the operating room labeled #1 demonstrates surgical instrument marking the L3-4 disc space level.  Spot films demonstrate pedicle screws and interbody bone spacers in good position without complicating features from L3-S1  IMPRESSION: L3-S1 fusion hardware without complicating features.   Electronically Signed   By: Loralie Champagne M.D.   On: 03/17/2013 15:03    Assessment/Plan: Continue Hemovac and the mobilization with physical outpatient therapy we'll recheck her CBC to make sure she's not into anemic. However she is not tachycardic and her blood pressure stable.  LOS: 2 days     Dawn Gross P 03/19/2013, 9:16 AM

## 2013-03-20 LAB — CBC WITH DIFFERENTIAL/PLATELET
Eosinophils Absolute: 0.3 10*3/uL (ref 0.0–0.7)
Hemoglobin: 8.7 g/dL — ABNORMAL LOW (ref 12.0–15.0)
Lymphocytes Relative: 19 % (ref 12–46)
Lymphs Abs: 1.3 10*3/uL (ref 0.7–4.0)
MCH: 29.1 pg (ref 26.0–34.0)
MCHC: 33 g/dL (ref 30.0–36.0)
Monocytes Absolute: 0.6 10*3/uL (ref 0.1–1.0)
Monocytes Relative: 9 % (ref 3–12)
Neutro Abs: 4.6 10*3/uL (ref 1.7–7.7)
Neutrophils Relative %: 68 % (ref 43–77)
RBC: 2.99 MIL/uL — ABNORMAL LOW (ref 3.87–5.11)
WBC: 6.8 10*3/uL (ref 4.0–10.5)

## 2013-03-20 NOTE — Progress Notes (Signed)
Occupational Therapy Treatment Patient Details Name: Dawn Gross MRN: [id number] DOB: 04/13/1951 Today's Date: 03/20/2013 Time: 4098-1191 OT Time Calculation (min): 41 min  OT Assessment / Plan / Recommendation  History of present illness Pt is a pleasent 62 y.o. female s/p multiple lumbar laminectomy with posterior lumbar fusion.   OT comments  Pt performed UB/LB bathing in session as well as toileting and dressing with AE. Pt wants to review tub transfer again tomorrow.   Follow Up Recommendations  Home health OT;Supervision/Assistance - 24 hour    Barriers to Discharge       Equipment Recommendations  3 in 1 bedside comode    Recommendations for Other Services    Frequency Min 2X/week   Progress towards OT Goals Progress towards OT goals: Progressing toward goals  Plan Discharge plan remains appropriate    Precautions / Restrictions Precautions Precautions: Back Precaution Comments: Pt able to state 3/3 back precautions. Required Braces or Orthoses: Spinal Brace Spinal Brace: Lumbar corset;Applied in sitting position   Pertinent Vitals/Pain Pain 4/10 in back. Repositioned.     ADL  Grooming: Wash/dry face;Min guard Where Assessed - Grooming: Supported standing Upper Body Bathing: Minimal assistance Where Assessed - Upper Body Bathing: Unsupported sitting Lower Body Bathing: Minimal assistance Where Assessed - Lower Body Bathing: Supported sit to stand Upper Body Dressing: Set up;Supervision/safety Where Assessed - Upper Body Dressing: Unsupported sitting Lower Body Dressing: Min guard Where Assessed - Lower Body Dressing: Supported sit to Pharmacist, hospital: Supervision/safety;Min guard Statistician Method: Sit to Barista: Raised toilet seat with arms (or 3-in-1 over toilet);Bedside commode Toileting - Clothing Manipulation and Hygiene: Minimal assistance Where Assessed - Toileting Clothing Manipulation and Hygiene: Sit to stand  from 3-in-1 or toilet Equipment Used: Gait belt;Rolling walker;Long-handled sponge;Reacher;Sock aid Transfers/Ambulation Related to ADLs: Min guard for ambulation. Supervision/Min guard for transfers. ADL Comments: Pt performed UB/LB bathing at sink. Pt practiced with AE for LB ADLs. Provided education and reviewed such as using two cups for teeth care and setting grooming items on dominant side of sink. Cues for precautions during session.     OT Diagnosis:    OT Problem List:   OT Treatment Interventions:     OT Goals(current goals can now be found in the care plan section) Acute Rehab OT Goals Patient Stated Goal: not stated OT Goal Formulation: With patient Time For Goal Achievement: 03/25/13 Potential to Achieve Goals: Good ADL Goals Pt Will Perform Grooming: with set-up;standing Pt Will Perform Lower Body Bathing: with min guard assist;sit to/from stand;with adaptive equipment Pt Will Perform Lower Body Dressing: with min guard assist;with adaptive equipment;sit to/from stand Pt Will Transfer to Toilet: ambulating;with supervision (3 in 1 over commode) Pt Will Perform Toileting - Clothing Manipulation and hygiene: with min assist;with adaptive equipment Pt Will Perform Tub/Shower Transfer: with min assist;with min guard assist;tub bench Additional ADL Goal #1: Pt will recall/verbalize 3/3 back precautions  Visit Information  Last OT Received On: 03/20/13 Assistance Needed: +1 History of Present Illness: Pt is a pleasent 62 y.o. female s/p multiple lumbar laminectomy with posterior lumbar fusion.    Subjective Data      Prior Functioning       Cognition  Cognition Arousal/Alertness: Awake/alert Behavior During Therapy: WFL for tasks assessed/performed Overall Cognitive Status: Within Functional Limits for tasks assessed    Mobility  Bed Mobility Bed Mobility: Rolling Left;Left Sidelying to Sit;Sit to Sidelying Left;Rolling Right Rolling Right: 5: Supervision;With  rail Rolling Left: 4: Min  guard Left Sidelying to Sit: 4: Min assist Details for Bed Mobility Assistance: Assist to lift trunk. Cues for precautions and technique. Transfers Transfers: Sit to Stand;Stand to Sit Sit to Stand: 4: Min guard;5: Supervision;With upper extremity assist;From bed;From chair/3-in-1 Stand to Sit: 4: Min guard;5: Supervision;With upper extremity assist;To chair/3-in-1;To bed Details for Transfer Assistance: cues for technique.    Exercises      Balance     End of Session OT - End of Session Equipment Utilized During Treatment: Gait belt;Rolling walker Activity Tolerance: Patient tolerated treatment well Patient left: in bed;with call bell/phone within reach  GO     Earlie Raveling OTR/L [phone] 03/20/2013, 12:40 PM

## 2013-03-20 NOTE — Progress Notes (Signed)
Patient ID: Dawn Gross, female   DOB: Mar 29, 1951, 62 y.o.   MRN: [id number] Patient is doing better less dizziness hold it was in a walking for a period time  Awake alert or his strength out of 5  Drain output significantly thickened decreased 380 cc over 24 hours her 70 over the last shift this is a significant improvement in the 24 hours prior we'll continue to Hemovac for now and we'll recheck her CBC and mobilize with therapy.

## 2013-03-20 NOTE — Progress Notes (Signed)
Physical Therapy Treatment Patient Details Name: Dawn Gross MRN: [id number] DOB: Dec 29, 1950 Today's Date: 03/20/2013 Time: 7829-5621 PT Time Calculation (min): 24 min  PT Assessment / Plan / Recommendation  History of Present Illness     PT Comments   Plan is for d/c home tomorrow.  Follow Up Recommendations  Home health PT;Supervision/Assistance - 24 hour     Does the patient have the potential to tolerate intense rehabilitation     Barriers to Discharge        Equipment Recommendations  None recommended by PT    Recommendations for Other Services    Frequency Min 5X/week   Progress towards PT Goals Progress towards PT goals: Progressing toward goals  Plan Current plan remains appropriate    Precautions / Restrictions Precautions Precautions: Back Precaution Comments: Reviewed back precautions.  Pt independently recalled 2/3. Required Braces or Orthoses: Spinal Brace Spinal Brace: Lumbar corset;Applied in sitting position   Pertinent Vitals/Pain 5/10    Mobility  Bed Mobility Bed Mobility: Left Sidelying to Sit Rolling Left: 5: Supervision Left Sidelying to Sit: 4: Min guard;HOB flat Transfers Sit to Stand: 4: Min guard;From bed;With upper extremity assist Stand to Sit: 4: Min guard;To chair/3-in-1;With upper extremity assist Details for Transfer Assistance: verbal cues for sequencing Ambulation/Gait Ambulation/Gait Assistance: 4: Min guard Ambulation Distance (Feet): 120 Feet Assistive device: Rolling walker Ambulation/Gait Assistance Details: verbal cues for posture Gait Pattern: Step-through pattern;Decreased stride length Gait velocity: decreased    Exercises     PT Diagnosis:    PT Problem List:   PT Treatment Interventions:     PT Goals (current goals can now be found in the care plan section)    Visit Information  Last PT Received On: 03/20/13 Assistance Needed: +1    Subjective Data      Cognition  Cognition Arousal/Alertness:  Awake/alert Behavior During Therapy: WFL for tasks assessed/performed Overall Cognitive Status: Within Functional Limits for tasks assessed    Balance     End of Session PT - End of Session Equipment Utilized During Treatment: Gait belt;Back brace Activity Tolerance: Patient tolerated treatment well Patient left: in chair;with call bell/phone within reach Nurse Communication: Mobility status   GP     Ilda Foil 03/20/2013, 10:07 AM  Aida Raider, PT  Office # [phone] Pager [phone]

## 2013-03-21 LAB — GLUCOSE, CAPILLARY: Glucose-Capillary: 82 mg/dL (ref 70–99)

## 2013-03-21 MED ORDER — GABAPENTIN 600 MG PO TABS
300.0000 mg | ORAL_TABLET | Freq: Three times a day (TID) | ORAL | Status: DC
  Filled 2013-03-21 (×3): qty 0.5

## 2013-03-21 MED ORDER — GABAPENTIN 300 MG PO CAPS
300.0000 mg | ORAL_CAPSULE | Freq: Three times a day (TID) | ORAL | Status: DC
  Administered 2013-03-21 – 2013-03-23 (×7): 300 mg via ORAL
  Filled 2013-03-21 (×9): qty 1

## 2013-03-21 MED ORDER — DEXAMETHASONE SODIUM PHOSPHATE 4 MG/ML IJ SOLN
12.0000 mg | Freq: Once | INTRAMUSCULAR | Status: AC
  Administered 2013-03-21: 12 mg via INTRAVENOUS
  Filled 2013-03-21: qty 3

## 2013-03-21 MED FILL — Sodium Chloride IV Soln 0.9%: INTRAVENOUS | Qty: 1000 | Status: AC

## 2013-03-21 MED FILL — Heparin Sodium (Porcine) Inj 1000 Unit/ML: INTRAMUSCULAR | Qty: 30 | Status: AC

## 2013-03-21 NOTE — Progress Notes (Signed)
Patient ID: Dawn Gross, female   DOB: [birth year], 62 y.o.   MRN: [id number] Subjective:  The patient is alert and pleasant. She looks well. She complains of pain in the bottom of her left foot. She has no known injury. She does not feel she is ready to go home his interested in inpatient rehabilitation.  Objective: Vital signs in last 24 hours: Temp:  [98.3 F (36.8 C)-99.1 F (37.3 C)] 98.3 F (36.8 C) (11/10 0604) Pulse Rate:  [74-91] 74 (11/10 0604) Resp:  [18-20] 18 (11/10 0604) BP: (94-105)/(41-58) 98/41 mmHg (11/10 0604) SpO2:  [95 %-98 %] 98 % (11/10 0604)  Intake/Output from previous day: 11/09 0701 - 11/10 0700 In: 240 [P.O.:240] Out: 200 [Drains:200] Intake/Output this shift:    Physical exam patient is alert and oriented. Her strength is normal. Her dressing is clean and dry.  Examination of the patient's left foot is unremarkable. She does have some tenderness to palpation along the plantar surface of her left foot.  Lab Results:  Recent Labs  03/19/13 0600 03/20/13 1200  WBC 8.6 6.8  HGB 8.5* 8.7*  HCT 26.0* 26.4*  PLT 173 189   BMET No results found for this basename: NA, K, CL, CO2, GLUCOSE, BUN, CREATININE, CALCIUM,  in the last 72 hours  Studies/Results: No results found.  Assessment/Plan: Postop day 4: The patient is doing well neurologically. She does not feel she is ready to go home and is interested in rehabilitation. I'll ask the rehabilitation team to see this patient.  Possible plantar fasciitis: I am reluctant to start nonsteroidal anti-inflammatory medicines with the patient's recent lumbar spine fusion for fear of inhibiting her fusion. I will give her a dose of Decadron and start Neurontin.  LOS: 4 days     Dawn Gross D 03/21/2013, 10:19 AM

## 2013-03-21 NOTE — Progress Notes (Signed)
Seen and agreed 03/21/2013 Robinette, Julia Elizabeth PTA [phone] pager [phone] office    

## 2013-03-21 NOTE — Progress Notes (Signed)
Thank you for consult on Dawn Gross. She did well with therapy today and is min to guard assist for transfers and mobility despite left foot pain. HHPT recommended for follow up and will defer consult for now.  She's too high level for CIR.

## 2013-03-21 NOTE — Progress Notes (Signed)
Physical Therapy Treatment Patient Details Name: Dawn Gross MRN: [id number] DOB: [birth year] Today's Date: 03/21/2013 Time: 4098-1191 PT Time Calculation (min): 23 min  PT Assessment / Plan / Recommendation  History of Present Illness Pt is a pleasent 62 y.o. female s/p multiple lumbar laminectomy with posterior lumbar fusion.   PT Comments   Pt progressing with mobility today despite increased L foot pain. Pt will benefit from further HHPT to increase functional independence at home and in community.  Follow Up Recommendations        Does the patient have the potential to tolerate intense rehabilitation     Barriers to Discharge        Equipment Recommendations       Recommendations for Other Services    Frequency     Progress towards PT Goals Progress towards PT goals: Progressing toward goals  Plan      Precautions / Restrictions Precautions Precautions: Back Precaution Booklet Issued: Yes (comment) Precaution Comments: Pt able to state 3/3 back precautions. Required Braces or Orthoses: Spinal Brace Spinal Brace: Lumbar corset;Applied in sitting position Restrictions Weight Bearing Restrictions: No   Pertinent Vitals/Pain 7/10 L foot pain; stated that it had decreased from 10/10 ~1hour prior to tx due to pain meds    Mobility  Bed Mobility Bed Mobility: Rolling Right;Right Sidelying to Sit;Sitting - Scoot to Edge of Bed Rolling Right: 5: Supervision Right Sidelying to Sit: 5: Supervision Sitting - Scoot to Edge of Bed: 5: Supervision Transfers Transfers: Stand to Sit;Sit to Stand Sit to Stand: 4: Min guard;With upper extremity assist;From bed Details for Transfer Assistance: cues for technique. Ambulation/Gait Ambulation/Gait Assistance: 4: Min guard Ambulation Distance (Feet): 200 Feet Assistive device: Rolling walker Ambulation/Gait Assistance Details: VC's for erect posture Gait Pattern: Step-through pattern;Decreased stride length;Decreased stance  time - left;Decreased step length - right Gait velocity: decreased General Gait Details: Pt had increased L foot pain today compared to previous tx's Stairs: Yes Stairs Assistance: 4: Min assist Stairs Assistance Details (indicate cue type and reason): Cues for safe technique; pt educated on importance of family/friend presence to assist for safety Stair Management Technique: One rail Right;Forwards Number of Stairs: 5    Exercises     PT Diagnosis:    PT Problem List:   PT Treatment Interventions:     PT Goals (current goals can now be found in the care plan section)    Visit Information  Last PT Received On: 03/21/13 Assistance Needed: +1 History of Present Illness: Pt is a pleasent 62 y.o. female s/p multiple lumbar laminectomy with posterior lumbar fusion.    Subjective Data      Cognition  Cognition Arousal/Alertness: Awake/alert Behavior During Therapy: WFL for tasks assessed/performed Overall Cognitive Status: Within Functional Limits for tasks assessed    Balance     End of Session PT - End of Session Equipment Utilized During Treatment: Gait belt;Back brace Activity Tolerance: Patient tolerated treatment well Patient left: in chair;with call bell/phone within reach;with nursing/sitter in room Nurse Communication: Mobility status   GP     Ernestina Columbia, SPTA 03/21/2013, 9:00 AM

## 2013-03-21 NOTE — Progress Notes (Addendum)
Occupational Therapy Treatment Patient Details Name: Dawn Gross MRN: [id number] DOB: [birth year] Today's Date: 03/21/2013 Time: 1191-4782 OT Time Calculation (min): 24 min  OT Assessment / Plan / Recommendation  History of present illness Pt is a pleasent 62 y.o. female s/p multiple lumbar laminectomy with posterior lumbar fusion.   OT comments  Pt left foot hurting her during session. Pt practiced tub transfer and OT reviewed education with pt. Recommend HHOT upon d/c.   Follow Up Recommendations  Home health OT;Supervision/Assistance - 24 hour    Barriers to Discharge       Equipment Recommendations  3 in 1 bedside comode    Recommendations for Other Services    Frequency Min 2X/week   Progress towards OT Goals Progress towards OT goals: Progressing toward goals  Plan Discharge plan remains appropriate    Precautions / Restrictions Precautions Precautions: Back Precaution Comments: Pt able to state 3/3 back precautions. Required Braces or Orthoses: Spinal Brace Spinal Brace: Lumbar corset;Applied in sitting position   Pertinent Vitals/Pain Pain 6/10 in back and 8/10 in left foot. Repositioned.    ADL  Upper Body Dressing: Supervision/Safety (back brace) Where Assessed - Upper Body Dressing: Unsupported sitting Toilet Transfer: Min guard Toilet Transfer Method: Sit to Barista: Bedside commode Tub/Shower Transfer: Performed;Min guard Tub/Shower Transfer Method: Science writer: Counsellor Used: Gait belt;Rolling walker;Back brace Transfers/Ambulation Related to ADLs: Min guard ADL Comments: Practiced donning/doffing back brace and also bed mobility. Pt ambulated from room to gym to practice tub transfer. OT reviewed education with pt. Cues for precautions during session.    OT Diagnosis:    OT Problem List:   OT Treatment Interventions:     OT Goals(current goals can now be found in the care  plan section) Acute Rehab OT Goals Patient Stated Goal: not stated OT Goal Formulation: With patient Time For Goal Achievement: 03/25/13 Potential to Achieve Goals: Good ADL Goals Pt Will Perform Grooming: with set-up;standing Pt Will Perform Lower Body Bathing: with min guard assist;sit to/from stand;with adaptive equipment Pt Will Perform Lower Body Dressing: with min guard assist;with adaptive equipment;sit to/from stand Pt Will Transfer to Toilet: ambulating;with supervision (3 in 1 over toilet) Pt Will Perform Toileting - Clothing Manipulation and hygiene: with min assist;with adaptive equipment Pt Will Perform Tub/Shower Transfer: with min assist;with min guard assist;tub bench Additional ADL Goal #1: Pt will recall/verbalize 3/3 back precautions  Visit Information  Last OT Received On: 03/21/13 Assistance Needed: +1 History of Present Illness: Pt is a pleasent 62 y.o. female s/p multiple lumbar laminectomy with posterior lumbar fusion.    Subjective Data      Prior Functioning       Cognition  Cognition Arousal/Alertness: Awake/alert Behavior During Therapy: WFL for tasks assessed/performed Overall Cognitive Status: Within Functional Limits for tasks assessed    Mobility  Bed Mobility Bed Mobility: Sit to Sidelying Left;Rolling Right;Rolling Left;Left Sidelying to Sit Rolling Right: 5: Supervision Rolling Left: 5: Supervision Left Sidelying to Sit: 4: Min guard Sit to Sidelying Left: 4: Min guard;4: Min assist Details for Bed Mobility Assistance: Cues for precautions and technique. Needing Min A for LE on first trial of sit to sidelying, then Min guard on second attempt. Transfers Transfers: Sit to Stand;Stand to Sit Sit to Stand: 4: Min guard;With upper extremity assist;From bed;From chair/3-in-1 Stand to Sit: 4: Min guard;With upper extremity assist;To chair/3-in-1;To bed    Exercises      Balance  End of Session OT - End of Session Equipment Utilized  During Treatment: Gait belt;Rolling walker;Back brace Activity Tolerance: Patient tolerated treatment well Patient left: in bed;with call bell/phone within reach;with bed alarm set  GO     Earlie Raveling OTR/L [phone] 03/21/2013, 1:53 PM

## 2013-03-22 LAB — GLUCOSE, CAPILLARY: Glucose-Capillary: 116 mg/dL — ABNORMAL HIGH (ref 70–99)

## 2013-03-22 NOTE — Progress Notes (Signed)
Seen and agreed 03/22/2013 Fredrich Birks PTA [phone] pager [phone] office

## 2013-03-22 NOTE — Social Work (Signed)
Referred to this CSW today for ?SNF. Chart reviewed and have spoken with [facility] and Care Coordinator who indicate patient plans to d/c home with [facility] and DME. CSW to sign off- please contact us if SW needs arise. Reece Levy, MSW, Theresia Majors [phone]

## 2013-03-22 NOTE — Progress Notes (Signed)
Patient ID: Dawn Gross, female   DOB: 05/12/1951, 62 y.o.   MRN: [id number] Subjective:  The patient is alert and pleasant. Rehabilitation does not feel the patient is appropriate for inpatient rehabilitation. The patient does not feel like she is able to go home today and wants to go home tomorrow.  Objective: Vital signs in last 24 hours: Temp:  [97.9 F (36.6 C)-100.3 F (37.9 C)] 97.9 F (36.6 C) (11/11 0530) Pulse Rate:  [71-91] 71 (11/11 0530) Resp:  [18-20] 20 (11/11 0530) BP: (99-119)/(56-69) 119/69 mmHg (11/11 0530) SpO2:  [95 %-100 %] 97 % (11/11 0530)  Intake/Output from previous day: 11/10 0701 - 11/11 0700 In: 240 [P.O.:240] Out: -  Intake/Output this shift:    Physical exam patient is alert and oriented. She is moving her lower extremities well.  Lab Results:  Recent Labs  03/20/13 1200  WBC 6.8  HGB 8.7*  HCT 26.4*  PLT 189   BMET No results found for this basename: NA, K, CL, CO2, GLUCOSE, BUN, CREATININE, CALCIUM,  in the last 72 hours  Studies/Results: No results found.  Assessment/Plan: Postop day #5: We will plan to send the patient home tomorrow.  LOS: 5 days     Dawn Gross D 03/22/2013, 7:58 AM

## 2013-03-22 NOTE — Progress Notes (Signed)
UR complete.  Hena Ewalt RN, MSN 

## 2013-03-22 NOTE — Progress Notes (Signed)
Physical Therapy Treatment Patient Details Name: Dawn Gross MRN: [id number] DOB: [birth year] Today's Date: 03/22/2013 Time: 4098-1191 PT Time Calculation (min): 23 min  PT Assessment / Plan / Recommendation  History of Present Illness Pt is a pleasent 62 y.o. female s/p multiple lumbar laminectomy with posterior lumbar fusion.   PT Comments   Pt did well with ambulation despite L foot pain due to possible plantar fascitis per MD note. Pt required max cues today to ambulate with heel strike instead of supinating foot.  Pt declined stair training today as pt said she felt confident; pt able to verbalize correct sequence and how niece will assist her with stairs at home. Continue with HH PT d/c plan as pt will benefit to gain functional independence.   Follow Up Recommendations  Home health PT;Supervision/Assistance - 24 hour     Does the patient have the potential to tolerate intense rehabilitation     Barriers to Discharge        Equipment Recommendations  None recommended by PT    Recommendations for Other Services    Frequency Min 5X/week   Progress towards PT Goals Progress towards PT goals: Progressing toward goals  Plan Current plan remains appropriate    Precautions / Restrictions Precautions Precautions: Back Precaution Comments: Pt able to state 3/3 back precautions. Required Braces or Orthoses: Spinal Brace Spinal Brace: Lumbar corset;Applied in sitting position Restrictions Weight Bearing Restrictions: No   Pertinent Vitals/Pain 6/10 L foot pain; decreased with seated rest break    Mobility  Bed Mobility Bed Mobility: Rolling Right;Right Sidelying to Sit;Sitting - Scoot to Edge of Bed Rolling Right: 6: Modified independent (Device/Increase time) Right Sidelying to Sit: 6: Modified independent (Device/Increase time) Sitting - Scoot to Edge of Bed: 5: Supervision Transfers Transfers: Stand to Sit;Sit to Stand Sit to Stand: With upper extremity assist;From  bed;5: Supervision Stand to Sit: With upper extremity assist;To chair/3-in-1;5: Supervision Details for Transfer Assistance: Min guard due to instability today due to L foot pain Ambulation/Gait Ambulation/Gait Assistance: 4: Min guard Ambulation Distance (Feet): 200 Feet Assistive device: Rolling walker Ambulation/Gait Assistance Details: Seated rest break required after 100' due to L foot pain; vc's for erect posture and for heel strike to decrease  L foot supination Gait Pattern: Step-through pattern;Decreased stride length;Decreased stance time - left;Decreased step length - right Gait velocity: decreased    Exercises     PT Diagnosis:    PT Problem List:   PT Treatment Interventions:     PT Goals (current goals can now be found in the care plan section)    Visit Information  Last PT Received On: 03/22/13 Assistance Needed: +1 History of Present Illness: Pt is a pleasent 62 y.o. female s/p multiple lumbar laminectomy with posterior lumbar fusion.    Subjective Data      Cognition  Cognition Arousal/Alertness: Awake/alert Behavior During Therapy: WFL for tasks assessed/performed Overall Cognitive Status: Within Functional Limits for tasks assessed    Balance     End of Session PT - End of Session Equipment Utilized During Treatment: Gait belt;Back brace Activity Tolerance: Patient tolerated treatment well Patient left: in chair;with call bell/phone within reach Nurse Communication: Mobility status   GP     Ernestina Columbia, SPTA 03/22/2013, 9:06 AM

## 2013-03-23 MED ORDER — DIAZEPAM 5 MG PO TABS: 5.0000 mg | ORAL_TABLET | Freq: Four times a day (QID) | ORAL | Status: DC | PRN

## 2013-03-23 MED ORDER — DSS 100 MG PO CAPS: 100.0000 mg | ORAL_CAPSULE | Freq: Two times a day (BID) | ORAL | Status: DC

## 2013-03-23 MED ORDER — GABAPENTIN 300 MG PO CAPS: 300.0000 mg | ORAL_CAPSULE | Freq: Three times a day (TID) | ORAL | Status: DC

## 2013-03-23 MED ORDER — OXYCODONE-ACETAMINOPHEN 10-325 MG PO TABS: 1.0000 | ORAL_TABLET | ORAL | Status: DC | PRN

## 2013-03-23 NOTE — Discharge Summary (Signed)
Physician Discharge Summary  Patient ID: Dawn Gross MRN: [id number] DOB/AGE: 1951-04-27 62 y.o.  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 *   Discharged Condition: good  Hospital Course: I performed an L3-4, L4-5 and L5-S1 decompression, instrumentation, and fusion on the patient on 03/17/2013. The surgery went well.  The patient's postoperative course was initially remarkable for increased Hemovac drain output. This decreased in Hemovac drain was discontinued.  We had PT and OT mobilize the patient. The patient did not feel like she was able to be discharged initially and we had the rehabilitation team see the patient. They did not think he qualifies for inpatient rehabilitation.  On 03/23/2013 the patient requested discharge to home. She was given oral and written discharge instructions. All her questions were answered.  Consults: PT/OT, rehabilitation Significant Diagnostic Studies: None Treatments: L3-4, L4-5 and L5-S1 decompression, each patient, and fusion. Discharge Exam: Blood pressure 112/68, pulse 69, temperature 98.7 F (37.1 C), temperature source Oral, resp. rate 18, SpO2 96.00%. Patient is alert and pleasant. She looks well. Her strength is normal in her lower extremities.  Disposition: Home  Discharge Orders   Future Orders Complete By Expires   Call MD for:  difficulty breathing, headache or visual disturbances  As directed    Call MD for:  extreme fatigue  As directed    Call MD for:  hives  As directed    Call MD for:  persistant dizziness or light-headedness  As directed    Call MD for:  persistant nausea and vomiting  As directed    Call MD for:  redness, tenderness, or signs of infection (pain, swelling,  redness, odor or green/yellow discharge around incision site)  As directed    Call MD for:  severe uncontrolled pain  As directed    Call MD for:  temperature >100.4  As directed    Diet - low sodium heart healthy  As directed    Discharge instructions  As directed    Comments:     Call [phone] for a followup appointment. Take a stool softener while you are using pain medications.   Driving Restrictions  As directed    Comments:     Do not drive for 2 weeks.   Increase activity slowly  As directed    Lifting restrictions  As directed    Comments:     Do not lift more than 5 pounds. No excessive bending or twisting.   May shower / Bathe  As directed    Comments:     He may shower after the pain she is removed 3 days after surgery. Leave the incision alone.   No dressing needed  As directed        Medication List    STOP taking these medications       HYDROcodone-acetaminophen 5-325 MG per tablet  Commonly known as:  NORCO/VICODIN     ibuprofen 200 MG tablet  Commonly known as:  ADVIL,MOTRIN      TAKE these medications       atorvastatin 20 MG tablet  Commonly known as:  LIPITOR  Take 20 mg by mouth daily.     diazepam 5 MG tablet  Commonly known as:  VALIUM  Take 1 tablet (5 mg total) by mouth every 6 (six) hours as  needed for muscle spasms.     diclofenac 75 MG EC tablet  Commonly known as:  VOLTAREN  Take 75 mg by mouth 2 (two) times daily.     DSS 100 MG Caps  Take 100 mg by mouth 2 (two) times daily.     ergocalciferol 50000 UNITS capsule  Commonly known as:  VITAMIN D2  Take 50,000 Units by mouth once a week. Saturday.     Fish Oil 600 MG Caps  Take 600 mg by mouth daily.     furosemide 20 MG tablet  Commonly known as:  LASIX  Take 20 mg by mouth daily as needed for edema.     gabapentin 300 MG capsule  Commonly known as:  NEURONTIN  Take 1 capsule (300 mg total) by mouth 3 (three) times daily.     lisinopril-hydrochlorothiazide 20-12.5 MG per  tablet  Commonly known as:  PRINZIDE,ZESTORETIC  Take 1 tablet by mouth daily.     oxyCODONE-acetaminophen 10-325 MG per tablet  Commonly known as:  PERCOCET  Take 1 tablet by mouth every 4 (four) hours as needed for pain.     potassium chloride 10 MEQ tablet  Commonly known as:  K-DUR  Take 10 mEq by mouth daily as needed (edema). Take with lasix         Signed: Lashina Milles D 03/23/2013, 8:45 AM

## 2013-03-23 NOTE — Progress Notes (Signed)
Pt D/c and instruction and follow up instruction given.condition at d/c is stable

## 2013-03-23 NOTE — Progress Notes (Signed)
Seen and agreed 03/23/2013 Teruko Joswick Elizabeth PTA [phone] pager [phone] office    

## 2013-03-23 NOTE — Care Management Note (Signed)
    Page 1 of 2   03/23/2013     11:09:38 AM   CARE MANAGEMENT NOTE 03/23/2013  Patient:  Dawn Gross, Dawn Gross   Account Number:  [id number]  Date Initiated:  03/23/2013  Documentation initiated by:  Elmer Bales  Subjective/Objective Assessment:   Patient admitted for PLIF. Lives at home alone     Action/Plan:   Will follow for discharge needs   Anticipated DC Date:  03/23/2013   Anticipated DC Plan:  HOME W HOME HEALTH SERVICES      DC Planning Services  CM consult      Choice offered to / List presented to:  C-1 Patient   DME arranged  3-N-1      DME agency  [facility]     HH arranged  HH-2 PT  HH-3 OT      [facility] agency  [facility]   Status of service:  Completed, signed off Medicare Important Message given?   (If response is "NO", the following Medicare IM given date fields will be blank) Date Medicare IM given:   Date Additional Medicare IM given:    Discharge Disposition:  HOME W HOME HEALTH SERVICES  Per UR Regulation:  Reviewed for med. necessity/level of care/duration of stay  If discussed at Long Length of Stay Meetings, dates discussed:    Comments:  03/23/13  1015 Elmer Bales RN, MSN, CM- Met with patient to discuss home health care.  Patient initially chose [place], who was unable to service her area.  Patient chose [facility]. Mary with [facility] was notified and accepted the referral.  Order was placed for recommended 3n1. Patient states she already has a rolling walker at home. [facility] DME was notified of need for 3n1 for discharge home today.

## 2013-03-23 NOTE — Progress Notes (Signed)
Physical Therapy Treatment Patient Details Name: Dawn Gross MRN: [id number] DOB: 02/23/1951 Today's Date: 03/23/2013 Time: 4098-1191 PT Time Calculation (min): 24 min  PT Assessment / Plan / Recommendation  History of Present Illness Pt is a pleasent 62 y.o. female s/p multiple lumbar laminectomy with posterior lumbar fusion.   PT Comments   Pt progressing with ambulation today and had safer gait pattern.  Pt declined stair training as she feels safe to do at home with family support and was able to verbalize safe technique. Pt will benefit from further PT to gain strength to increase functional independence.  Few cues required today to maintain bathroom functional activities and with mobility.  Follow Up Recommendations  Home health PT;Supervision/Assistance - 24 hour     Does the patient have the potential to tolerate intense rehabilitation     Barriers to Discharge        Equipment Recommendations  None recommended by PT    Recommendations for Other Services    Frequency Min 5X/week   Progress towards PT Goals Progress towards PT goals: Progressing toward goals  Plan Current plan remains appropriate    Precautions / Restrictions Precautions Precautions: Back Precaution Comments: Pt able to state 3/3 back precautions. Required Braces or Orthoses: Spinal Brace Spinal Brace: Lumbar corset;Applied in sitting position Restrictions Weight Bearing Restrictions: No   Pertinent Vitals/Pain 4/10 headache; nursing was administering meds.  Stated "very little pain with foot and back"    Mobility  Bed Mobility Bed Mobility: Rolling Right;Right Sidelying to Sit;Sitting - Scoot to [place] of Bed Rolling Right: 6: Modified independent (Device/Increase time) Right Sidelying to Sit: 6: Modified independent (Device/Increase time) Sitting - Scoot to Edge of Bed: 6: Modified independent (Device/Increase time) Details for Bed Mobility Assistance: Pt able to demonstrate bed mobility while  maintaining precautions Transfers Transfers: Stand to Sit;Sit to Stand Sit to Stand: From toilet;From bed;6: Modified independent (Device/Increase time) Stand to Sit: To chair/3-in-1;To toilet;6: Modified independent (Device/Increase time);5: Supervision Details for Transfer Assistance: Pt able to demo transfers with safe technique Ambulation/Gait Ambulation/Gait Assistance: 5: Supervision;4: Min guard Ambulation Distance (Feet): 275 Feet Assistive device: Rolling walker Ambulation/Gait Assistance Details: Pt became fatigued after 130' requiring min guard for safety and seated rest break; fewer cues today for erect posture and heel strike Gait Pattern: Step-through pattern;Decreased stride length Gait velocity: increased from previous tx Stairs: No Stairs Assistance Details (indicate cue type and reason): Pt stated that she felt comfortable doing stairs at home; pt verbalized safe technique and how family will assist her at home    Exercises     PT Diagnosis:    PT Problem List:   PT Treatment Interventions:     PT Goals (current goals can now be found in the care plan section)    Visit Information  Last PT Received On: 03/23/13 Assistance Needed: +1 History of Present Illness: Pt is a pleasent 62 y.o. female s/p multiple lumbar laminectomy with posterior lumbar fusion.    Subjective Data      Cognition  Cognition Arousal/Alertness: Awake/alert Behavior During Therapy: WFL for tasks assessed/performed Overall Cognitive Status: Within Functional Limits for tasks assessed    Balance     End of Session PT - End of Session Equipment Utilized During Treatment: Gait belt;Back brace Activity Tolerance: Patient tolerated treatment well Patient left: in chair;with call bell/phone within reach Nurse Communication: Mobility status   GP     Ernestina Columbia, SPTA 03/23/2013, 9:25 AM

## 2013-07-27 ENCOUNTER — Other Ambulatory Visit (HOSPITAL_COMMUNITY): Payer: Self-pay | Admitting: Internal Medicine

## 2013-07-27 DIAGNOSIS — Z139 Encounter for screening, unspecified: Secondary | ICD-10-CM

## 2013-07-27 DIAGNOSIS — Z1231 Encounter for screening mammogram for malignant neoplasm of breast: Secondary | ICD-10-CM

## 2013-08-02 ENCOUNTER — Ambulatory Visit (HOSPITAL_COMMUNITY)
Admission: RE | Admit: 2013-08-02 | Discharge: 2013-08-02 | Disposition: A | Discharge status: Home / Self Care | Payer: Medicare Other | Source: Ambulatory Visit | Attending: Internal Medicine | Admitting: Internal Medicine

## 2013-08-02 DIAGNOSIS — Z1231 Encounter for screening mammogram for malignant neoplasm of breast: Secondary | ICD-10-CM | POA: Insufficient documentation

## 2013-12-21 ENCOUNTER — Encounter (INDEPENDENT_AMBULATORY_CARE_PROVIDER_SITE_OTHER): Payer: Medicare Other | Admitting: Ophthalmology

## 2013-12-21 DIAGNOSIS — I10 Essential (primary) hypertension: Secondary | ICD-10-CM

## 2013-12-21 DIAGNOSIS — H43819 Vitreous degeneration, unspecified eye: Secondary | ICD-10-CM

## 2013-12-21 DIAGNOSIS — H35039 Hypertensive retinopathy, unspecified eye: Secondary | ICD-10-CM

## 2013-12-21 DIAGNOSIS — H2510 Age-related nuclear cataract, unspecified eye: Secondary | ICD-10-CM

## 2014-05-16 ENCOUNTER — Other Ambulatory Visit: Payer: Self-pay | Admitting: Neurosurgery

## 2014-05-16 DIAGNOSIS — M25512 Pain in left shoulder: Secondary | ICD-10-CM

## 2014-05-23 ENCOUNTER — Other Ambulatory Visit: Payer: Self-pay

## 2014-05-24 ENCOUNTER — Ambulatory Visit
Admission: RE | Admit: 2014-05-24 | Discharge: 2014-05-24 | Disposition: A | Discharge status: Home / Self Care | Payer: Medicare Other | Source: Ambulatory Visit | Attending: Neurosurgery | Admitting: Neurosurgery

## 2014-05-24 ENCOUNTER — Other Ambulatory Visit: Payer: Self-pay

## 2014-05-24 DIAGNOSIS — M25512 Pain in left shoulder: Secondary | ICD-10-CM

## 2014-08-24 ENCOUNTER — Other Ambulatory Visit (HOSPITAL_COMMUNITY): Payer: Self-pay | Admitting: Internal Medicine

## 2014-08-24 DIAGNOSIS — Z1231 Encounter for screening mammogram for malignant neoplasm of breast: Secondary | ICD-10-CM

## 2014-08-26 ENCOUNTER — Emergency Department (HOSPITAL_COMMUNITY)
Admission: EM | Admit: 2014-08-26 | Discharge: 2014-08-26 | Disposition: A | Discharge status: Home / Self Care | Payer: Medicare Other | Attending: Emergency Medicine | Admitting: Emergency Medicine

## 2014-08-26 ENCOUNTER — Emergency Department (HOSPITAL_COMMUNITY): Payer: Medicare Other

## 2014-08-26 ENCOUNTER — Encounter (HOSPITAL_COMMUNITY): Payer: Self-pay | Admitting: Cardiology

## 2014-08-26 DIAGNOSIS — W01198A Fall on same level from slipping, tripping and stumbling with subsequent striking against other object, initial encounter: Secondary | ICD-10-CM | POA: Diagnosis not present

## 2014-08-26 DIAGNOSIS — Y9389 Activity, other specified: Secondary | ICD-10-CM | POA: Diagnosis not present

## 2014-08-26 DIAGNOSIS — W19XXXA Unspecified fall, initial encounter: Secondary | ICD-10-CM

## 2014-08-26 DIAGNOSIS — S4992XA Unspecified injury of left shoulder and upper arm, initial encounter: Secondary | ICD-10-CM | POA: Diagnosis not present

## 2014-08-26 DIAGNOSIS — Z79899 Other long term (current) drug therapy: Secondary | ICD-10-CM | POA: Diagnosis not present

## 2014-08-26 DIAGNOSIS — Y998 Other external cause status: Secondary | ICD-10-CM | POA: Insufficient documentation

## 2014-08-26 DIAGNOSIS — I10 Essential (primary) hypertension: Secondary | ICD-10-CM | POA: Insufficient documentation

## 2014-08-26 DIAGNOSIS — Z87891 Personal history of nicotine dependence: Secondary | ICD-10-CM | POA: Diagnosis not present

## 2014-08-26 DIAGNOSIS — S20212A Contusion of left front wall of thorax, initial encounter: Secondary | ICD-10-CM | POA: Diagnosis not present

## 2014-08-26 DIAGNOSIS — Y9289 Other specified places as the place of occurrence of the external cause: Secondary | ICD-10-CM | POA: Diagnosis not present

## 2014-08-26 DIAGNOSIS — S0993XA Unspecified injury of face, initial encounter: Secondary | ICD-10-CM | POA: Diagnosis present

## 2014-08-26 DIAGNOSIS — S0083XA Contusion of other part of head, initial encounter: Secondary | ICD-10-CM | POA: Insufficient documentation

## 2014-08-26 DIAGNOSIS — Z791 Long term (current) use of non-steroidal anti-inflammatories (NSAID): Secondary | ICD-10-CM | POA: Diagnosis not present

## 2014-08-26 DIAGNOSIS — M1990 Unspecified osteoarthritis, unspecified site: Secondary | ICD-10-CM | POA: Diagnosis not present

## 2014-08-26 MED ORDER — OXYCODONE-ACETAMINOPHEN 5-325 MG PO TABS
1.0000 | ORAL_TABLET | Freq: Once | ORAL | Status: AC
  Administered 2014-08-26: 1 via ORAL
  Filled 2014-08-26: qty 1

## 2014-08-26 NOTE — ED Provider Notes (Signed)
CSN: [id number]     Arrival date & time 08/26/14  1811 History   First MD Initiated Contact with Patient 08/26/14 1821     Chief Complaint  Patient presents with  . Fall     (Consider location/radiation/quality/duration/timing/severity/associated sxs/prior Treatment) Patient is a 64 y.o. female presenting with fall. The history is provided by the patient.  Fall This is a new problem. Associated symptoms include chest pain. Pertinent negatives include no abdominal pain, no headaches and no shortness of breath.   patient was getting out of the shower states she slipped. She landed on her left face on the faucet. Complaining of pain in her left face left shoulder and left lower anterior chest. No loss conscious. No confusion. No chest pain. No numbness or weakness. No difficulty breathing. States she does have some chronic problems with her left shoulder. She is not on anticoagulation.  Past Medical History  Diagnosis Date  . Hypertension   . Arthritis    Past Surgical History  Procedure Laterality Date  . Cholecystectomy    . Abdominal hysterectomy    . Joint replacement Bilateral     knees  . Back surgery     History reviewed. No pertinent family history. History  Substance Use Topics  . Smoking status: Former Smoker -- 0.25 packs/day for 4 years    Types: Cigarettes    Quit date: 03/10/1979  . Smokeless tobacco: Not on file  . Alcohol Use: No   OB History    No data available     Review of Systems  Constitutional: Negative for appetite change.  HENT: Negative for ear discharge and hearing loss.   Respiratory: Negative for shortness of breath.   Cardiovascular: Positive for chest pain.  Gastrointestinal: Negative for abdominal pain.  Endocrine: Negative for polydipsia.  Genitourinary: Negative for flank pain.  Musculoskeletal: Negative for back pain.  Skin: Positive for wound.  Neurological: Negative for tremors, syncope, speech difficulty, numbness and headaches.       Allergies  Review of patient's allergies indicates no known allergies.  Home Medications   Prior to Admission medications   Medication Sig Start Date End Date Taking? Authorizing Provider  Cyanocobalamin (B-12 PO) Take 1 tablet by mouth daily.   Yes Historical Provider, MD  diclofenac (VOLTAREN) 75 MG EC tablet Take 75 mg by mouth 2 (two) times daily.   Yes Historical Provider, MD  ergocalciferol (VITAMIN D2) 50000 UNITS capsule Take 50,000 Units by mouth once a week. Saturday.   Yes Historical Provider, MD  furosemide (LASIX) 20 MG tablet Take 20 mg by mouth daily as needed for edema.   Yes Historical Provider, MD  HYDROcodone-acetaminophen (NORCO) 10-325 MG per tablet Take 1 tablet by mouth every 4 (four) hours as needed. 06/20/14  Yes Historical Provider, MD  lisinopril (PRINIVIL,ZESTRIL) 20 MG tablet Take 20 mg by mouth daily. 08/12/14  Yes Historical Provider, MD  Omega-3 Fatty Acids (FISH OIL) 600 MG CAPS Take 600 mg by mouth daily.   Yes Historical Provider, MD  potassium chloride (K-DUR) 10 MEQ tablet Take 10 mEq by mouth daily as needed (edema). Take with lasix   Yes Historical Provider, MD  diazepam (VALIUM) 5 MG tablet Take 1 tablet (5 mg total) by mouth every 6 (six) hours as needed for muscle spasms. Patient not taking: Reported on 08/26/2014 03/23/13   Tressie Stalker, MD  docusate sodium 100 MG CAPS Take 100 mg by mouth 2 (two) times daily. Patient not taking: Reported on 08/26/2014 03/23/13  Tressie StalkerJeffrey Jenkins, MD  gabapentin (NEURONTIN) 300 MG capsule Take 1 capsule (300 mg total) by mouth 3 (three) times daily. Patient not taking: Reported on 08/26/2014 03/23/13   Tressie StalkerJeffrey Jenkins, MD  oxyCODONE-acetaminophen (PERCOCET) 10-325 MG per tablet Take 1 tablet by mouth every 4 (four) hours as needed for pain. Patient not taking: Reported on 08/26/2014 03/23/13   Tressie StalkerJeffrey Jenkins, MD   BP 155/112 mmHg  Pulse 78  Temp(Src) 99.3 F (37.4 C) (Oral)  Resp 13  Ht 5\' 2"  (1.575 m)  Wt  282 lb (127.914 kg)  BMI 51.57 kg/m2  SpO2 100% Physical Exam  Constitutional: She appears well-developed and well-nourished.  HENT:  Some swelling to left side of face over cheek bone. No underlying bony tenderness when probed through the mouth.  Eyes: EOM are normal. Pupils are equal, round, and reactive to light.  Neck: Normal range of motion. Neck supple.  Cardiovascular: Normal rate.   Pulmonary/Chest: Effort normal and breath sounds normal. She exhibits tenderness.  Tenderness to left anterior lower chest wall. No crepitance or deformity no subcutaneous emphysema.  Abdominal: Soft. There is no tenderness.  No left upper quadrant tenderness.  Musculoskeletal:  Tenderness to left shoulder over the clavicle laterally. No deformity. Neurovascularly intact over left hand and strong radial pulse on left wrist.  Neurological: She is alert.  Skin: Skin is warm.    ED Course  Procedures (including critical care time) Labs Review Labs Reviewed - No data to display  Imaging Review Dg Ribs Unilateral W/chest Left  08/26/2014   CLINICAL DATA:  Fall with left lower anterior rib pain.  EXAM: LEFT RIBS AND CHEST - 3+ VIEW  COMPARISON:  03/09/2013  FINDINGS: The cortical margins of the left ribs are intact. No fracture or destructive rib lesion. Lower rib evaluation partially obscured by soft tissue attenuation from body habitus. Cardiomediastinal contours are unchanged, heart at the upper limits of normal in size. Pulmonary vasculature is normal. No consolidation, pleural effusion, or pneumothorax. Degenerative change noted in both shoulders.  IMPRESSION: No evidence of left rib fracture.  No acute intrathoracic process.   Electronically Signed   By: Rubye OaksMelanie  Ehinger M.D.   On: 08/26/2014 20:22     EKG Interpretation None      MDM   Final diagnoses:  Fall, initial encounter  Facial contusion, initial encounter  Chest wall contusion, left, initial encounter    Patient with fall.  Facial contusion but doubt fracture. Doubt intracranial injury. Does have some contusion to left lower chest with no rib fractures on x-ray. No abdominal tenderness. Doubt severe intra-abdominal intrathoracic injury. Does have tenderness on left clavicle laterally. Will get dedicated clavicular films.    Benjiman CoreNathan Rosemond Lyttle, MD 08/26/14 2029

## 2014-08-26 NOTE — ED Notes (Signed)
Pt states she fell today, says she did hit her head: left temple area on the left side. Denies LOC. C/o of left sided face pain, left arm pain and pain under left breast. Denies all other symptoms.

## 2014-08-26 NOTE — Discharge Instructions (Signed)
Chest Contusion A chest contusion is a deep bruise on your chest area. Contusions are the result of an injury that caused bleeding under the skin. A chest contusion may involve bruising of the skin, muscles, or ribs. The contusion may turn blue, purple, or yellow. Minor injuries will give you a painless contusion, but more severe contusions may stay painful and swollen for a few weeks. CAUSES  A contusion is usually caused by a blow, trauma, or direct force to an area of the body. SYMPTOMS   Swelling and redness of the injured area.  Discoloration of the injured area.  Tenderness and soreness of the injured area.  Pain. DIAGNOSIS  The diagnosis can be made by taking a history and performing a physical exam. An X-ray, CT scan, or MRI may be needed to determine if there were any associated injuries, such as broken bones (fractures) or internal injuries. TREATMENT  Often, the best treatment for a chest contusion is resting, icing, and applying cold compresses to the injured area. Deep breathing exercises may be recommended to reduce the risk of pneumonia. Over-the-counter medicines may also be recommended for pain control. HOME CARE INSTRUCTIONS   Put ice on the injured area.  Put ice in a plastic bag.  Place a towel between your skin and the bag.  Leave the ice on for 15-20 minutes, 03-04 times a day.  Only take over-the-counter or prescription medicines as directed by your caregiver. Your caregiver may recommend avoiding anti-inflammatory medicines (aspirin, ibuprofen, and naproxen) for 48 hours because these medicines may increase bruising.  Rest the injured area.  Perform deep-breathing exercises as directed by your caregiver.  Stop smoking if you smoke.  Do not lift objects over 5 pounds (2.3 kg) for 3 days or longer if recommended by your caregiver. SEEK IMMEDIATE MEDICAL CARE IF:   You have increased bruising or swelling.  You have pain that is getting worse.  You have  difficulty breathing.  You have dizziness, weakness, or fainting.  You have blood in your urine or stool.  You cough up or vomit blood.  Your swelling or pain is not relieved with medicines. MAKE SURE YOU:   Understand these instructions.  Will watch your condition.  Will get help right away if you are not doing well or get worse. Document Released: 01/21/2001 Document Revised: 01/21/2012 Document Reviewed: 10/20/2011 [facility] Patient Information 2015 [place], [place]. This information is not intended to replace advice given to you by your health care provider. Make sure you discuss any questions you have with your health care provider.  Facial or Scalp Contusion A facial or scalp contusion is a deep bruise on the face or head. Injuries to the face and head generally cause a lot of swelling, especially around the eyes. Contusions are the result of an injury that caused bleeding under the skin. The contusion may turn blue, purple, or yellow. Minor injuries will give you a painless contusion, but more severe contusions may stay painful and swollen for a few weeks.  CAUSES  A facial or scalp contusion is caused by a blunt injury or trauma to the face or head area.  SIGNS AND SYMPTOMS   Swelling of the injured area.   Discoloration of the injured area.   Tenderness, soreness, or pain in the injured area.  DIAGNOSIS  The diagnosis can be made by taking a medical history and doing a physical exam. An X-ray exam, CT scan, or MRI may be needed to determine if there are  any associated injuries, such as broken bones (fractures). TREATMENT  Often, the best treatment for a facial or scalp contusion is applying cold compresses to the injured area. Over-the-counter medicines may also be recommended for pain control.  HOME CARE INSTRUCTIONS   Only take over-the-counter or prescription medicines as directed by your health care provider.   Apply ice to the injured area.   Put ice in a  plastic bag.   Place a towel between your skin and the bag.   Leave the ice on for 20 minutes, 2-3 times a day.  SEEK MEDICAL CARE IF:  You have bite problems.   You have pain with chewing.   You are concerned about facial defects. SEEK IMMEDIATE MEDICAL CARE IF:  You have severe pain or a headache that is not relieved by medicine.   You have unusual sleepiness, confusion, or personality changes.   You throw up (vomit).   You have a persistent nosebleed.   You have double vision or blurred vision.   You have fluid drainage from your nose or ear.   You have difficulty walking or using your arms or legs.  MAKE SURE YOU:   Understand these instructions.  Will watch your condition.  Will get help right away if you are not doing well or get worse. Document Released: 06/05/2004 Document Revised: 02/16/2013 Document Reviewed: 12/09/2012 [facility] Patient Information 2015 [place], [place]. This information is not intended to replace advice given to you by your health care provider. Make sure you discuss any questions you have with your health care provider.

## 2014-08-26 NOTE — ED Notes (Signed)
Larey SeatFell today in the shower.  C/o left facial pain.  Left sided rib pain, and left shoulder pain.

## 2014-08-31 ENCOUNTER — Ambulatory Visit (HOSPITAL_COMMUNITY)
Admission: RE | Admit: 2014-08-31 | Discharge: 2014-08-31 | Disposition: A | Discharge status: Home / Self Care | Payer: Medicare Other | Source: Ambulatory Visit | Attending: Internal Medicine | Admitting: Internal Medicine

## 2014-08-31 DIAGNOSIS — Z1231 Encounter for screening mammogram for malignant neoplasm of breast: Secondary | ICD-10-CM | POA: Diagnosis not present

## 2015-01-16 ENCOUNTER — Ambulatory Visit (INDEPENDENT_AMBULATORY_CARE_PROVIDER_SITE_OTHER): Payer: Self-pay | Admitting: Neurology

## 2015-01-16 ENCOUNTER — Encounter: Payer: Self-pay | Admitting: Neurology

## 2015-01-16 ENCOUNTER — Ambulatory Visit (INDEPENDENT_AMBULATORY_CARE_PROVIDER_SITE_OTHER): Payer: Medicare Other | Admitting: Neurology

## 2015-01-16 DIAGNOSIS — M5416 Radiculopathy, lumbar region: Secondary | ICD-10-CM | POA: Diagnosis not present

## 2015-01-16 DIAGNOSIS — G5602 Carpal tunnel syndrome, left upper limb: Secondary | ICD-10-CM | POA: Diagnosis not present

## 2015-01-16 DIAGNOSIS — M25579 Pain in unspecified ankle and joints of unspecified foot: Secondary | ICD-10-CM

## 2015-01-16 NOTE — Procedures (Signed)
HISTORY:  Dawn Gross is a 64 year old patient with a history of prior lumbosacral spine surgery. The patient has a two-year history of discomfort down both legs. She reports some numbness in the feet, but she has significant discomfort in the feet with weightbearing, no significant pain when off of her feet. She has had a prior history of borderline diabetes, and has a history of significant obesity. He is to be evaluated for a possible peripheral neuropathy.  NERVE CONDUCTION STUDIES:  Nerve conduction studies were performed on the left upper extremity. The distal motor latency for the left median nerve was prolonged, with a normal motor amplitude. The distal motor latency and motor amplitudes for the left ulnar nerve were normal. The F wave latencies and nerve conduction velocities for the left median and ulnar nerves were normal. The sensory latency for the left median nerve was prolonged, normal for the left ulnar nerve.  Nerve conduction studies were performed on both lower extremities. The distal motor latencies for the posterior tibial and peroneal nerves were normal bilaterally, with low motor amplitudes for these nerves bilaterally. The nerve conduction velocities for the peroneal and posterior tibial nerves were normal bilaterally. The sensory latencies for the peroneal nerves were normal bilaterally.  EMG STUDIES:  EMG study was performed on the left lower extremity:  The tibialis anterior muscle reveals 2 to 5K motor units with decreased recruitment. No fibrillations or positive waves were seen. The peroneus tertius muscle reveals 2 to 4K motor units with decreased recruitment. No fibrillations or positive waves were seen. The medial gastrocnemius muscle reveals 1 to 3K motor units with full recruitment. No fibrillations or positive waves were seen. The vastus lateralis muscle reveals 2 to 4K motor units with full recruitment. No fibrillations or positive waves were seen. The  iliopsoas muscle reveals 2 to 4K motor units with full recruitment. No fibrillations or positive waves were seen. The biceps femoris muscle (long head) reveals 2 to 4K motor units with full recruitment. No fibrillations or positive waves were seen. The lumbosacral paraspinal muscles were tested at 3 levels, and revealed no abnormalities of insertional activity at all 3 levels tested. There was good relaxation.  EMG study was performed on the right lower extremity:  The tibialis anterior muscle reveals 2 to 4K motor units with full recruitment. No fibrillations or positive waves were seen. The peroneus tertius muscle reveals 2 to 5K motor units with decreased recruitment. No fibrillations or positive waves were seen. The medial gastrocnemius muscle reveals 1 to 3K motor units with full recruitment. No fibrillations or positive waves were seen. The vastus lateralis muscle reveals 2 to 4K motor units with full recruitment. No fibrillations or positive waves were seen. The iliopsoas muscle reveals 2 to 4K motor units with full recruitment. No fibrillations or positive waves were seen. The biceps femoris muscle (long head) reveals 2 to 4K motor units with full recruitment. No fibrillations or positive waves were seen. The lumbosacral paraspinal muscles were tested at 3 levels, and revealed no abnormalities of insertional activity at all 3 levels tested. There was fair relaxation.   IMPRESSION:  Nerve conduction studies done on the left upper extremity and on both lower extremities shows evidence of a mild left carpal tunnel syndrome. There appears to be lowering of motor amplitudes in the peroneal and posterior tibial nerves bilaterally with sensory sparing. A peripheral neuropathy cannot be diagnosed with certainty. EMG evaluation of both lower extremities shows mild chronic stable signs of denervation  primarily in the L5 nerve root distributions, possibly related to a prior healed L5 radiculopathy  bilaterally.  Marlan Palau MD 01/16/2015 11:10 AM  [facility] [address] [place], [place] [address]  Phone [phone] Fax [phone]

## 2015-01-16 NOTE — Progress Notes (Signed)
Please refer to EMG and NCV procedure note. 

## 2015-01-25 ENCOUNTER — Encounter: Payer: Medicare Other | Admitting: Neurology

## 2015-02-13 ENCOUNTER — Telehealth: Payer: Self-pay | Admitting: *Deleted

## 2015-04-12 ENCOUNTER — Telehealth: Payer: Self-pay | Admitting: *Deleted

## 2015-04-12 NOTE — Telephone Encounter (Signed)
No message error.

## 2015-06-25 ENCOUNTER — Encounter (HOSPITAL_COMMUNITY)
Admission: RE | Admit: 2015-06-25 | Discharge: 2015-06-25 | Disposition: A | Discharge status: Home / Self Care | Payer: Medicare Other | Source: Ambulatory Visit | Attending: Orthopedic Surgery | Admitting: Orthopedic Surgery

## 2015-06-25 ENCOUNTER — Other Ambulatory Visit: Payer: Self-pay

## 2015-06-25 ENCOUNTER — Encounter (HOSPITAL_COMMUNITY): Payer: Self-pay

## 2015-06-25 DIAGNOSIS — Z01812 Encounter for preprocedural laboratory examination: Secondary | ICD-10-CM | POA: Diagnosis not present

## 2015-06-25 DIAGNOSIS — Z87891 Personal history of nicotine dependence: Secondary | ICD-10-CM | POA: Diagnosis not present

## 2015-06-25 DIAGNOSIS — M19012 Primary osteoarthritis, left shoulder: Secondary | ICD-10-CM | POA: Diagnosis not present

## 2015-06-25 DIAGNOSIS — E785 Hyperlipidemia, unspecified: Secondary | ICD-10-CM | POA: Diagnosis not present

## 2015-06-25 DIAGNOSIS — Z79899 Other long term (current) drug therapy: Secondary | ICD-10-CM | POA: Insufficient documentation

## 2015-06-25 DIAGNOSIS — I10 Essential (primary) hypertension: Secondary | ICD-10-CM | POA: Diagnosis not present

## 2015-06-25 DIAGNOSIS — Z01818 Encounter for other preprocedural examination: Secondary | ICD-10-CM | POA: Diagnosis not present

## 2015-06-25 HISTORY — DX: Major depressive disorder, single episode, unspecified: F32.9

## 2015-06-25 HISTORY — DX: Headache, unspecified: R51.9

## 2015-06-25 HISTORY — DX: Depression, unspecified: F32.A

## 2015-06-25 HISTORY — DX: Edema, unspecified: R60.9

## 2015-06-25 HISTORY — DX: Nocturia: R35.1

## 2015-06-25 HISTORY — DX: Effusion, unspecified joint: M25.40

## 2015-06-25 HISTORY — DX: Headache: R51

## 2015-06-25 HISTORY — DX: Polyneuropathy, unspecified: G62.9

## 2015-06-25 HISTORY — DX: Pain in unspecified joint: M25.50

## 2015-06-25 HISTORY — DX: Localized edema: R60.0

## 2015-06-25 HISTORY — DX: Unspecified cataract: H26.9

## 2015-06-25 HISTORY — DX: Hyperlipidemia, unspecified: E78.5

## 2015-06-25 LAB — COMPREHENSIVE METABOLIC PANEL
ALBUMIN: 3.8 g/dL (ref 3.5–5.0)
ALT: 16 U/L (ref 14–54)
AST: 20 U/L (ref 15–41)
Alkaline Phosphatase: 96 U/L (ref 38–126)
Anion gap: 11 (ref 5–15)
BUN: 18 mg/dL (ref 6–20)
CALCIUM: 9.4 mg/dL (ref 8.9–10.3)
CHLORIDE: 109 mmol/L (ref 101–111)
CO2: 24 mmol/L (ref 22–32)
CREATININE: 0.71 mg/dL (ref 0.44–1.00)
GFR calc Af Amer: >60 mL/min (ref >60)
Glucose, Bld: 81 mg/dL (ref 65–99)
Potassium: 3.7 mmol/L (ref 3.5–5.1)
Sodium: 144 mmol/L (ref 135–145)
Total Bilirubin: 0.5 mg/dL (ref 0.3–1.2)
Total Protein: 7 g/dL (ref 6.5–8.1)

## 2015-06-25 LAB — CBC WITH DIFFERENTIAL/PLATELET
BASOS ABS: 0 10*3/uL (ref 0.0–0.1)
BASOS PCT: 1 %
EOS PCT: 4 %
Eosinophils Absolute: 0.2 10*3/uL (ref 0.0–0.7)
HCT: 40.7 % (ref 36.0–46.0)
Hemoglobin: 13 g/dL (ref 12.0–15.0)
LYMPHS PCT: 36 %
Lymphs Abs: 1.8 10*3/uL (ref 0.7–4.0)
MCH: 27.9 pg (ref 26.0–34.0)
MCHC: 31.9 g/dL (ref 30.0–36.0)
MCV: 87.3 fL (ref 78.0–100.0)
Monocytes Absolute: 0.3 10*3/uL (ref 0.1–1.0)
Monocytes Relative: 7 %
NEUTROS ABS: 2.7 10*3/uL (ref 1.7–7.7)
Neutrophils Relative %: 52 %
PLATELETS: 221 10*3/uL (ref 150–400)
RBC: 4.66 MIL/uL (ref 3.87–5.11)
RDW: 15.7 % — ABNORMAL HIGH (ref 11.5–15.5)
WBC: 5 10*3/uL (ref 4.0–10.5)

## 2015-06-25 LAB — SURGICAL PCR SCREEN
MRSA, PCR: NEGATIVE
Staphylococcus aureus: NEGATIVE

## 2015-06-25 LAB — PROTIME-INR
INR: 1.01 (ref 0.00–1.49)
PROTHROMBIN TIME: 13.5 s (ref 11.6–15.2)

## 2015-06-25 LAB — APTT: APTT: 33 s (ref 24–37)

## 2015-06-25 MED ORDER — CHLORHEXIDINE GLUCONATE 4 % EX LIQD: 60.0000 mL | Freq: Once | CUTANEOUS | Status: DC

## 2015-06-25 NOTE — Progress Notes (Addendum)
Cardiologist denies  Medical Md Dr.Vyas  Echo denies  Stress test denies   Heart cath denies  EKG denies in past yr  CXR denies in past yr

## 2015-06-25 NOTE — Pre-Procedure Instructions (Signed)
Dawn Gross  06/25/2015      [facility] - [place], [place] - [address] [place] [address] Phone: [phone] Fax: [phone]    Your procedure is scheduled on Thurs, Feb 23 @ 10:30 AM  Report to [facility] Admitting at 8:30 AM  Call this number if you have problems the morning of surgery:  [phone]   Remember:  Do not eat food or drink liquids after midnight.  Take these medicines the morning of surgery with A SIP OF WATER Pain Pill(if needed)              Stop taking your Diclofenac,Fish Oil,any Vitamins or Herbal Medications a week prior to surgery. No Aspirin,Ibuprofen,Aleve,Motrin,or Advil.   Do not wear jewelry, make-up or nail polish.  Do not wear lotions, powders, or perfumes.    Do not shave 48 hours prior to surgery.    Do not bring valuables to the hospital.  [facility] is not responsible for any belongings or valuables.  Contacts, dentures or bridgework may not be worn into surgery.  Leave your suitcase in the car.  After surgery it may be brought to your room.  For patients admitted to the hospital, discharge time will be determined by your treatment team.  Patients discharged the day of surgery will not be allowed to drive home.    Special instructions:  [place] - Preparing for Surgery  Before surgery, you can play an important role.  Because skin is not sterile, your skin needs to be as free of germs as possible.  You can reduce the number of germs on you skin by washing with CHG (chlorahexidine gluconate) soap before surgery.  CHG is an antiseptic cleaner which kills germs and bonds with the skin to continue killing germs even after washing.  Please DO NOT use if you have an allergy to CHG or antibacterial soaps.  If your skin becomes reddened/irritated stop using the CHG and inform your nurse when you arrive at Short Stay.  Do not shave (including legs and underarms) for at least 48 hours prior to  the first CHG shower.  You may shave your face.  Please follow these instructions carefully:   1.  Shower with CHG Soap the night before surgery and the                                morning of Surgery.  2.  If you choose to wash your hair, wash your hair first as usual with your       normal shampoo.  3.  After you shampoo, rinse your hair and body thoroughly to remove the                      Shampoo.  4.  Use CHG as you would any other liquid soap.  You can apply chg directly       to the skin and wash gently with scrungie or a clean washcloth.  5.  Apply the CHG Soap to your body ONLY FROM THE NECK DOWN.        Do not use on open wounds or open sores.  Avoid contact with your eyes,       ears, mouth and genitals (private parts).  Wash genitals (private parts)       with your normal soap.  6.  Wash thoroughly, paying special attention to the area where your surgery        will be performed.  7.  Thoroughly rinse your body with warm water from the neck down.  8.  DO NOT shower/wash with your normal soap after using and rinsing off       the CHG Soap.  9.  Pat yourself dry with a clean towel.            10.  Wear clean pajamas.            11.  Place clean sheets on your bed the night of your first shower and do not        sleep with pets.  Day of Surgery  Do not apply any lotions/deoderants the morning of surgery.  Please wear clean clothes to the hospital/surgery center.    Please read over the following fact sheets that you were given. Pain Booklet, Coughing and Deep Breathing, MRSA Information and Surgical Site Infection Prevention

## 2015-06-26 NOTE — Progress Notes (Signed)
Anesthesia Chart Review:  Pt is a 65 year old female scheduled for L total shoulder arthroplasty on 07/05/15 with Dr. Rennis Chris.   PMH includes:  HTN, hyperlipidemia. Former smoker. BMI 51. S/p lumbar laminectomy/PLIF 03/17/13.  Medications include: lasix, lisinopril, potassium.   Preoperative labs reviewed.    EKG 06/25/15: NSR. Low voltage QRS. Cannot rule out Anterior infarct, age undetermined. Appears similar to tracing dated 03/09/13.   If no changes, I anticipate pt can proceed with surgery as scheduled.   Rica Mast, FNP-BC [facility] Short Stay Surgical Center/Anesthesiology Phone: [phone] 06/26/2015 4:30 PM

## 2015-07-04 MED ORDER — LACTATED RINGERS IV SOLN
INTRAVENOUS | Status: DC
Start: 2015-07-05 — End: 2015-07-05
  Administered 2015-07-05 (×2): via INTRAVENOUS

## 2015-07-04 MED ORDER — TRANEXAMIC ACID 1000 MG/10ML IV SOLN
1000.0000 mg | INTRAVENOUS | Status: AC
  Administered 2015-07-05: 1000 mg via INTRAVENOUS
  Filled 2015-07-04: qty 10

## 2015-07-04 MED ORDER — DEXTROSE 5 % IV SOLN
3.0000 g | INTRAVENOUS | Status: AC
  Administered 2015-07-05: 3 g via INTRAVENOUS
  Filled 2015-07-04: qty 3000

## 2015-07-05 ENCOUNTER — Inpatient Hospital Stay (HOSPITAL_COMMUNITY): Payer: Medicare Other | Admitting: Anesthesiology

## 2015-07-05 ENCOUNTER — Encounter (HOSPITAL_COMMUNITY)
Admission: AD | Disposition: A | Discharge status: Skilled Nursing Facility | Payer: Self-pay | Source: Ambulatory Visit | Attending: Orthopedic Surgery

## 2015-07-05 ENCOUNTER — Inpatient Hospital Stay (HOSPITAL_COMMUNITY)
Admission: AD | Admit: 2015-07-05 | Discharge: 2015-07-06 | DRG: 483 | Disposition: A | Discharge status: Skilled Nursing Facility | Payer: Medicare Other | Source: Ambulatory Visit | Attending: Orthopedic Surgery | Admitting: Orthopedic Surgery

## 2015-07-05 ENCOUNTER — Encounter (HOSPITAL_COMMUNITY): Payer: Self-pay | Admitting: General Practice

## 2015-07-05 ENCOUNTER — Inpatient Hospital Stay (HOSPITAL_COMMUNITY): Payer: Medicare Other | Admitting: Emergency Medicine

## 2015-07-05 DIAGNOSIS — Z79899 Other long term (current) drug therapy: Secondary | ICD-10-CM | POA: Diagnosis not present

## 2015-07-05 DIAGNOSIS — Z87891 Personal history of nicotine dependence: Secondary | ICD-10-CM | POA: Diagnosis not present

## 2015-07-05 DIAGNOSIS — I10 Essential (primary) hypertension: Secondary | ICD-10-CM | POA: Diagnosis present

## 2015-07-05 DIAGNOSIS — E785 Hyperlipidemia, unspecified: Secondary | ICD-10-CM | POA: Diagnosis present

## 2015-07-05 DIAGNOSIS — M545 Low back pain: Secondary | ICD-10-CM | POA: Diagnosis present

## 2015-07-05 DIAGNOSIS — Z96612 Presence of left artificial shoulder joint: Secondary | ICD-10-CM

## 2015-07-05 DIAGNOSIS — M19012 Primary osteoarthritis, left shoulder: Principal | ICD-10-CM | POA: Diagnosis present

## 2015-07-05 DIAGNOSIS — Z96643 Presence of artificial hip joint, bilateral: Secondary | ICD-10-CM | POA: Diagnosis present

## 2015-07-05 DIAGNOSIS — H269 Unspecified cataract: Secondary | ICD-10-CM | POA: Diagnosis present

## 2015-07-05 DIAGNOSIS — Z96653 Presence of artificial knee joint, bilateral: Secondary | ICD-10-CM | POA: Diagnosis present

## 2015-07-05 DIAGNOSIS — G8929 Other chronic pain: Secondary | ICD-10-CM | POA: Diagnosis present

## 2015-07-05 DIAGNOSIS — Z96619 Presence of unspecified artificial shoulder joint: Secondary | ICD-10-CM

## 2015-07-05 HISTORY — PX: TOTAL SHOULDER ARTHROPLASTY: SHX126

## 2015-07-05 HISTORY — DX: Other chronic pain: G89.29

## 2015-07-05 HISTORY — DX: Low back pain, unspecified: M54.50

## 2015-07-05 HISTORY — DX: Low back pain: M54.5

## 2015-07-05 SURGERY — ARTHROPLASTY, SHOULDER, TOTAL
Anesthesia: General | Site: Shoulder | Laterality: Left

## 2015-07-05 MED ORDER — 0.9 % SODIUM CHLORIDE (POUR BTL) OPTIME
TOPICAL | Status: DC | PRN
  Administered 2015-07-05: 1000 mL

## 2015-07-05 MED ORDER — ESMOLOL HCL 100 MG/10ML IV SOLN
INTRAVENOUS | Status: AC
  Filled 2015-07-05: qty 10

## 2015-07-05 MED ORDER — ESMOLOL HCL 100 MG/10ML IV SOLN
INTRAVENOUS | Status: DC | PRN
  Administered 2015-07-05 (×2): 10 mg via INTRAVENOUS

## 2015-07-05 MED ORDER — FENTANYL CITRATE (PF) 100 MCG/2ML IJ SOLN
INTRAMUSCULAR | Status: AC
  Filled 2015-07-05: qty 2

## 2015-07-05 MED ORDER — METOCLOPRAMIDE HCL 5 MG PO TABS: 5.0000 mg | ORAL_TABLET | Freq: Three times a day (TID) | ORAL | Status: DC | PRN

## 2015-07-05 MED ORDER — PROPOFOL 10 MG/ML IV BOLUS
INTRAVENOUS | Status: AC
  Filled 2015-07-05: qty 20

## 2015-07-05 MED ORDER — ONDANSETRON HCL 4 MG/2ML IJ SOLN: 4.0000 mg | Freq: Once | INTRAMUSCULAR | Status: DC | PRN

## 2015-07-05 MED ORDER — SUGAMMADEX SODIUM 200 MG/2ML IV SOLN
INTRAVENOUS | Status: AC
  Filled 2015-07-05: qty 2

## 2015-07-05 MED ORDER — PHENOL 1.4 % MT LIQD: 1.0000 | OROMUCOSAL | Status: DC | PRN

## 2015-07-05 MED ORDER — HYDROMORPHONE HCL 1 MG/ML IJ SOLN
0.2500 mg | INTRAMUSCULAR | Status: DC | PRN
  Administered 2015-07-05: 0.5 mg via INTRAVENOUS

## 2015-07-05 MED ORDER — MENTHOL 3 MG MT LOZG: 1.0000 | LOZENGE | OROMUCOSAL | Status: DC | PRN

## 2015-07-05 MED ORDER — HYDROMORPHONE HCL 1 MG/ML IJ SOLN
INTRAMUSCULAR | Status: AC
  Filled 2015-07-05: qty 1

## 2015-07-05 MED ORDER — CEFAZOLIN SODIUM-DEXTROSE 2-3 GM-% IV SOLR
2.0000 g | Freq: Four times a day (QID) | INTRAVENOUS | Status: AC
  Administered 2015-07-05 – 2015-07-06 (×3): 2 g via INTRAVENOUS
  Filled 2015-07-05 (×3): qty 50

## 2015-07-05 MED ORDER — DEXAMETHASONE SODIUM PHOSPHATE 4 MG/ML IJ SOLN
INTRAMUSCULAR | Status: AC
  Filled 2015-07-05: qty 1

## 2015-07-05 MED ORDER — MEPERIDINE HCL 25 MG/ML IJ SOLN: 6.2500 mg | INTRAMUSCULAR | Status: DC | PRN

## 2015-07-05 MED ORDER — DIAZEPAM 5 MG PO TABS
5.0000 mg | ORAL_TABLET | Freq: Four times a day (QID) | ORAL | Status: DC | PRN
  Administered 2015-07-05 – 2015-07-06 (×2): 5 mg via ORAL
  Filled 2015-07-05 (×2): qty 1

## 2015-07-05 MED ORDER — ONDANSETRON HCL 4 MG PO TABS: 4.0000 mg | ORAL_TABLET | Freq: Four times a day (QID) | ORAL | Status: DC | PRN

## 2015-07-05 MED ORDER — MIDAZOLAM HCL 2 MG/2ML IJ SOLN
2.0000 mg | Freq: Once | INTRAMUSCULAR | Status: AC
  Administered 2015-07-05: 2 mg via INTRAVENOUS

## 2015-07-05 MED ORDER — PHENYLEPHRINE HCL 10 MG/ML IJ SOLN
10.0000 mg | INTRAVENOUS | Status: DC | PRN
  Administered 2015-07-05: 25 ug/min via INTRAVENOUS

## 2015-07-05 MED ORDER — ONDANSETRON HCL 4 MG/2ML IJ SOLN
INTRAMUSCULAR | Status: AC
  Filled 2015-07-05: qty 2

## 2015-07-05 MED ORDER — MIDAZOLAM HCL 2 MG/2ML IJ SOLN
INTRAMUSCULAR | Status: AC
  Filled 2015-07-05: qty 2

## 2015-07-05 MED ORDER — LACTATED RINGERS IV SOLN: INTRAVENOUS | Status: DC

## 2015-07-05 MED ORDER — SUGAMMADEX SODIUM 200 MG/2ML IV SOLN
INTRAVENOUS | Status: DC | PRN
  Administered 2015-07-05: 200 mg via INTRAVENOUS

## 2015-07-05 MED ORDER — PROPOFOL 10 MG/ML IV BOLUS
INTRAVENOUS | Status: DC | PRN
  Administered 2015-07-05 (×3): 20 mg via INTRAVENOUS
  Administered 2015-07-05: 160 mg via INTRAVENOUS
  Administered 2015-07-05: 20 mg via INTRAVENOUS

## 2015-07-05 MED ORDER — ONDANSETRON HCL 4 MG/2ML IJ SOLN: 4.0000 mg | Freq: Four times a day (QID) | INTRAMUSCULAR | Status: DC | PRN

## 2015-07-05 MED ORDER — FENTANYL CITRATE (PF) 100 MCG/2ML IJ SOLN
INTRAMUSCULAR | Status: DC | PRN
  Administered 2015-07-05: 100 ug via INTRAVENOUS
  Administered 2015-07-05 (×3): 50 ug via INTRAVENOUS

## 2015-07-05 MED ORDER — OXYCODONE HCL 5 MG PO TABS
5.0000 mg | ORAL_TABLET | ORAL | Status: DC | PRN
  Administered 2015-07-05 (×2): 5 mg via ORAL
  Administered 2015-07-06: 10 mg via ORAL
  Filled 2015-07-05: qty 1
  Filled 2015-07-05: qty 2
  Filled 2015-07-05: qty 1

## 2015-07-05 MED ORDER — METOCLOPRAMIDE HCL 5 MG/ML IJ SOLN: 5.0000 mg | Freq: Three times a day (TID) | INTRAMUSCULAR | Status: DC | PRN

## 2015-07-05 MED ORDER — DOCUSATE SODIUM 100 MG PO CAPS
100.0000 mg | ORAL_CAPSULE | Freq: Two times a day (BID) | ORAL | Status: DC
  Administered 2015-07-05 – 2015-07-06 (×2): 100 mg via ORAL
  Filled 2015-07-05 (×3): qty 1

## 2015-07-05 MED ORDER — FLEET ENEMA 7-19 GM/118ML RE ENEM: 1.0000 | ENEMA | Freq: Once | RECTAL | Status: DC | PRN

## 2015-07-05 MED ORDER — LIDOCAINE HCL (CARDIAC) 20 MG/ML IV SOLN
INTRAVENOUS | Status: AC
  Filled 2015-07-05: qty 10

## 2015-07-05 MED ORDER — ACETAMINOPHEN 650 MG RE SUPP: 650.0000 mg | Freq: Four times a day (QID) | RECTAL | Status: DC | PRN

## 2015-07-05 MED ORDER — ONDANSETRON HCL 4 MG/2ML IJ SOLN
INTRAMUSCULAR | Status: DC | PRN
  Administered 2015-07-05: 4 mg via INTRAVENOUS

## 2015-07-05 MED ORDER — ALUM & MAG HYDROXIDE-SIMETH 200-200-20 MG/5ML PO SUSP: 30.0000 mL | ORAL | Status: DC | PRN

## 2015-07-05 MED ORDER — KETOROLAC TROMETHAMINE 15 MG/ML IJ SOLN
7.5000 mg | Freq: Four times a day (QID) | INTRAMUSCULAR | Status: AC
  Administered 2015-07-05 – 2015-07-06 (×4): 7.5 mg via INTRAVENOUS
  Filled 2015-07-05 (×4): qty 1

## 2015-07-05 MED ORDER — BISACODYL 5 MG PO TBEC
5.0000 mg | DELAYED_RELEASE_TABLET | Freq: Every day | ORAL | Status: DC | PRN
  Administered 2015-07-06: 5 mg via ORAL
  Filled 2015-07-05: qty 1

## 2015-07-05 MED ORDER — ACETAMINOPHEN 325 MG PO TABS
650.0000 mg | ORAL_TABLET | Freq: Four times a day (QID) | ORAL | Status: DC | PRN
  Administered 2015-07-06: 650 mg via ORAL
  Filled 2015-07-05: qty 2

## 2015-07-05 MED ORDER — FENTANYL CITRATE (PF) 250 MCG/5ML IJ SOLN
INTRAMUSCULAR | Status: AC
  Filled 2015-07-05: qty 5

## 2015-07-05 MED ORDER — HYDROMORPHONE HCL 1 MG/ML IJ SOLN: 1.0000 mg | INTRAMUSCULAR | Status: DC | PRN

## 2015-07-05 MED ORDER — FENTANYL CITRATE (PF) 100 MCG/2ML IJ SOLN
50.0000 ug | Freq: Once | INTRAMUSCULAR | Status: AC
  Administered 2015-07-05: 50 ug via INTRAVENOUS

## 2015-07-05 MED ORDER — LIDOCAINE HCL (CARDIAC) 20 MG/ML IV SOLN
INTRAVENOUS | Status: DC | PRN
  Administered 2015-07-05: 100 mg via INTRAVENOUS

## 2015-07-05 MED ORDER — DIPHENHYDRAMINE HCL 12.5 MG/5ML PO ELIX: 12.5000 mg | ORAL_SOLUTION | ORAL | Status: DC | PRN

## 2015-07-05 MED ORDER — ROCURONIUM BROMIDE 100 MG/10ML IV SOLN
INTRAVENOUS | Status: DC | PRN
  Administered 2015-07-05: 50 mg via INTRAVENOUS

## 2015-07-05 MED ORDER — LISINOPRIL 20 MG PO TABS
20.0000 mg | ORAL_TABLET | Freq: Every day | ORAL | Status: DC
  Administered 2015-07-05 – 2015-07-06 (×2): 20 mg via ORAL
  Filled 2015-07-05 (×2): qty 1

## 2015-07-05 MED ORDER — POLYETHYLENE GLYCOL 3350 17 G PO PACK: 17.0000 g | PACK | Freq: Every day | ORAL | Status: DC | PRN

## 2015-07-05 SURGICAL SUPPLY — 70 items
ADH SKN CLS APL DERMABOND .7 (GAUZE/BANDAGES/DRESSINGS) ×1
ADH SKN CLS LQ APL DERMABOND (GAUZE/BANDAGES/DRESSINGS) ×1
AID PSTN UNV HD RSTRNT DISP (MISCELLANEOUS) ×1
BLADE SAW SGTL 83.5X18.5 (BLADE) ×3 IMPLANT
CEMENT BONE DEPUY (Cement) ×3 IMPLANT
COVER SURGICAL LIGHT HANDLE (MISCELLANEOUS) ×3 IMPLANT
DERMABOND ADHESIVE PROPEN (GAUZE/BANDAGES/DRESSINGS) ×2
DERMABOND ADVANCED (GAUZE/BANDAGES/DRESSINGS) ×2
DERMABOND ADVANCED .7 DNX12 (GAUZE/BANDAGES/DRESSINGS) IMPLANT
DERMABOND ADVANCED .7 DNX6 (GAUZE/BANDAGES/DRESSINGS) ×1 IMPLANT
DRAPE ORTHO SPLIT 77X108 STRL (DRAPES) ×6
DRAPE SURG 17X11 SM STRL (DRAPES) ×3 IMPLANT
DRAPE SURG ORHT 6 SPLT 77X108 (DRAPES) ×2 IMPLANT
DRAPE U-SHAPE 47X51 STRL (DRAPES) ×3 IMPLANT
DRILL BIT 7/64X5 (BIT) IMPLANT
DRSG AQUACEL AG ADV 3.5X10 (GAUZE/BANDAGES/DRESSINGS) ×3 IMPLANT
DURAPREP 26ML APPLICATOR (WOUND CARE) ×3 IMPLANT
ELECT BLADE 4.0 EZ CLEAN MEGAD (MISCELLANEOUS) ×3
ELECT CAUTERY BLADE 6.4 (BLADE) ×3 IMPLANT
ELECT REM PT RETURN 9FT ADLT (ELECTROSURGICAL) ×3
ELECTRODE BLDE 4.0 EZ CLN MEGD (MISCELLANEOUS) ×1 IMPLANT
ELECTRODE REM PT RTRN 9FT ADLT (ELECTROSURGICAL) ×1 IMPLANT
FACESHIELD WRAPAROUND (MASK) ×9 IMPLANT
FACESHIELD WRAPAROUND OR TEAM (MASK) ×3 IMPLANT
GLENOID WITH CLEAT SM (Miscellaneous) ×2 IMPLANT
GLOVE BIO SURGEON STRL SZ7.5 (GLOVE) ×3 IMPLANT
GLOVE BIO SURGEON STRL SZ8 (GLOVE) ×3 IMPLANT
GLOVE EUDERMIC 7 POWDERFREE (GLOVE) ×3 IMPLANT
GLOVE SS BIOGEL STRL SZ 7.5 (GLOVE) ×1 IMPLANT
GLOVE SUPERSENSE BIOGEL SZ 7.5 (GLOVE) ×2
GOWN STRL REUS W/ TWL LRG LVL3 (GOWN DISPOSABLE) ×1 IMPLANT
GOWN STRL REUS W/ TWL XL LVL3 (GOWN DISPOSABLE) ×2 IMPLANT
GOWN STRL REUS W/TWL LRG LVL3 (GOWN DISPOSABLE) ×3
GOWN STRL REUS W/TWL XL LVL3 (GOWN DISPOSABLE) ×6
HEAD HUMERAL USP II 44/17 (Head) ×2 IMPLANT
KIT BASIN OR (CUSTOM PROCEDURE TRAY) ×3 IMPLANT
KIT ROOM TURNOVER OR (KITS) ×3 IMPLANT
KIT SET UNIVERSAL (KITS) ×2 IMPLANT
MANIFOLD NEPTUNE II (INSTRUMENTS) ×3 IMPLANT
NDL HYPO 25GX1X1/2 BEV (NEEDLE) IMPLANT
NDL SUT .5 MAYO 1.404X.05X (NEEDLE) ×1 IMPLANT
NEEDLE HYPO 25GX1X1/2 BEV (NEEDLE) IMPLANT
NEEDLE MAYO TAPER (NEEDLE) ×3
NS IRRIG 1000ML POUR BTL (IV SOLUTION) ×3 IMPLANT
PACK SHOULDER (CUSTOM PROCEDURE TRAY) ×3 IMPLANT
PAD ARMBOARD 7.5X6 YLW CONV (MISCELLANEOUS) ×6 IMPLANT
PASSER SUT SWANSON 36MM LOOP (INSTRUMENTS) IMPLANT
RESTRAINT HEAD UNIVERSAL NS (MISCELLANEOUS) ×3 IMPLANT
SLING ARM FOAM STRAP LRG (SOFTGOODS) IMPLANT
SLING ARM FOAM STRAP XLG (SOFTGOODS) ×2 IMPLANT
SLING ARM XL FOAM STRAP (SOFTGOODS) ×3 IMPLANT
SMARTMIX MINI TOWER (MISCELLANEOUS) ×3
SPONGE LAP 18X18 X RAY DECT (DISPOSABLE) IMPLANT
SPONGE LAP 4X18 X RAY DECT (DISPOSABLE) ×3 IMPLANT
STEM HUMERAL APEX UNI 9MM (Stem) ×2 IMPLANT
SUCTION FRAZIER HANDLE 10FR (MISCELLANEOUS) ×2
SUCTION TUBE FRAZIER 10FR DISP (MISCELLANEOUS) ×1 IMPLANT
SUT BONE WAX W31G (SUTURE) IMPLANT
SUT FIBERWIRE #2 38 T-5 BLUE (SUTURE) ×6
SUT MNCRL AB 3-0 PS2 18 (SUTURE) ×3 IMPLANT
SUT VIC AB 1 CT1 27 (SUTURE) ×6
SUT VIC AB 1 CT1 27XBRD ANBCTR (SUTURE) ×2 IMPLANT
SUT VIC AB 2-0 CT1 27 (SUTURE) ×3
SUT VIC AB 2-0 CT1 TAPERPNT 27 (SUTURE) ×1 IMPLANT
SUTURE FIBERWR #2 38 T-5 BLUE (SUTURE) ×2 IMPLANT
SYR CONTROL 10ML LL (SYRINGE) IMPLANT
TOWEL OR 17X24 6PK STRL BLUE (TOWEL DISPOSABLE) ×3 IMPLANT
TOWEL OR 17X26 10 PK STRL BLUE (TOWEL DISPOSABLE) ×3 IMPLANT
TOWER SMARTMIX MINI (MISCELLANEOUS) ×1 IMPLANT
WATER STERILE IRR 1000ML POUR (IV SOLUTION) ×3 IMPLANT

## 2015-07-05 NOTE — Progress Notes (Signed)
Pt arrived to the unit from PACU via bed with IV intact and transfusing. Pt oriented to the unit and room; VSS; pt educated on call light and safety precautions/prevention. Pt left should incision dsg has small scant stain now marked but no active bleeding noted. LUE remains in sling with ice pack applied to site. Neuro check wnl; pt in bed with call light within reach and family at bedside. Will closely monitor. Dawn Bucy RN

## 2015-07-05 NOTE — Transfer of Care (Signed)
Immediate Anesthesia Transfer of Care Note  Patient: Dawn Gross  Procedure(s) Performed: Procedure(s): LEFT TOTAL SHOULDER ARTHROPLASTY (Left)  Patient Location: PACU  Anesthesia Type:GA combined with regional for post-op pain  Level of Consciousness: awake, alert  and oriented  Airway & Oxygen Therapy: Patient Spontanous Breathing and Patient connected to nasal cannula oxygen  Post-op Assessment: Report given to RN, Post -op Vital signs reviewed and stable and Patient moving all extremities X 4  Post vital signs: Reviewed and stable  Last Vitals:  Filed Vitals:   07/05/15 0915 07/05/15 1238  BP: 167/87 125/70  Pulse: 93 90  Temp:  36.2 C  Resp: 27 30    Complications: No apparent anesthesia complications

## 2015-07-05 NOTE — H&P (Signed)
Dawn Gross    Chief Complaint: LEFT SHOULDER OSTEOARTHRITIS HPI: The patient is a 65 y.o. female with end stage left shoulder OA  Past Medical History  Diagnosis Date  . Arthritis   . Hypertension     takes Lisinopril daily  . Peripheral edema     takes Lasix daily as needed  . Cataract     immature,right  . Hyperlipidemia     takes Fish Oil daily  . Headache   . Joint pain   . Joint swelling   . Chronic back pain   . Peripheral neuropathy (HCC)     no meds  . Nocturia   . Depression     hx of-no meds    Past Surgical History  Procedure Laterality Date  . Cholecystectomy    . Abdominal hysterectomy    . Joint replacement Bilateral     knees  . Back surgery      fusion  . Colonoscopy      History reviewed. No pertinent family history.  Social History:  reports that she has quit smoking. Her smoking use included Cigarettes. She has a 1 pack-year smoking history. She does not have any smokeless tobacco history on file. She reports that she drinks alcohol. She reports that she does not use illicit drugs.   Medications Prior to Admission  Medication Sig Dispense Refill  . Cyanocobalamin (B-12 PO) Take 1 tablet by mouth daily.    . diclofenac (VOLTAREN) 75 MG EC tablet Take 75 mg by mouth 2 (two) times daily.    . ergocalciferol (VITAMIN D2) 50000 UNITS capsule Take 50,000 Units by mouth once a week. Saturday.    . furosemide (LASIX) 20 MG tablet Take 20 mg by mouth daily as needed for edema.    Marland Kitchen HYDROcodone-acetaminophen (NORCO) 10-325 MG per tablet Take 1 tablet by mouth every 4 (four) hours as needed for moderate pain.     Marland Kitchen lisinopril (PRINIVIL,ZESTRIL) 20 MG tablet Take 20 mg by mouth daily.    . Magnesium 250 MG TABS Take 250 mg by mouth daily.    . Omega-3 Fatty Acids (FISH OIL) 600 MG CAPS Take 600 mg by mouth daily.    . potassium chloride (K-DUR) 10 MEQ tablet Take 10 mEq by mouth daily as needed (edema). Take with lasix       Physical Exam: left  shoulder with painful and restricted motin as noted at recent office visits  Vitals  Temp:  [98 F (36.7 C)] 98 F (36.7 C) (02/23 0806) Pulse Rate:  [66-93] 93 (02/23 0915) Resp:  [18-27] 27 (02/23 0915) BP: (128-167)/(74-87) 167/87 mmHg (02/23 0915) SpO2:  [99 %-100 %] 100 % (02/23 0915) Weight:  [125.737 kg (277 lb 3.2 oz)] 125.737 kg (277 lb 3.2 oz) (02/23 0806)  Assessment/Plan  Impression: LEFT SHOULDER OSTEOARTHRITIS  Plan of Action: Procedure(s): LEFT TOTAL SHOULDER ARTHROPLASTY  Dawn Gross 07/05/2015, 9:54 AM Contact # [phone]

## 2015-07-05 NOTE — Anesthesia Preprocedure Evaluation (Signed)
Anesthesia Evaluation  Patient identified by MRN, date of birth, ID band Patient awake    Reviewed: Allergy & Precautions, NPO status , Patient's Chart, lab work & pertinent test results  Airway Mallampati: I  TM Distance: >3 FB Neck ROM: Full    Dental   Pulmonary former smoker,    Pulmonary exam normal        Cardiovascular hypertension, Pt. on medications Normal cardiovascular exam     Neuro/Psych Depression    GI/Hepatic   Endo/Other    Renal/GU      Musculoskeletal   Abdominal   Peds  Hematology   Anesthesia Other Findings   Reproductive/Obstetrics                             Anesthesia Physical Anesthesia Plan  ASA: II  Anesthesia Plan: General   Post-op Pain Management: GA combined w/ Regional for post-op pain   Induction: Intravenous  Airway Management Planned: Oral ETT  Additional Equipment:   Intra-op Plan:   Post-operative Plan: Extubation in OR  Informed Consent: I have reviewed the patients History and Physical, chart, labs and discussed the procedure including the risks, benefits and alternatives for the proposed anesthesia with the patient or authorized representative who has indicated his/her understanding and acceptance.     Plan Discussed with: CRNA and Surgeon  Anesthesia Plan Comments:         Anesthesia Quick Evaluation

## 2015-07-05 NOTE — Op Note (Signed)
NAMECHEROKEE, BOCCIO NO.:  [id number]  MEDICAL RECORD NO.:  [id number]  LOCATION:  [id number]                        FACILITY:  [facility]  PHYSICIAN:  Vania Rea. Aidric Endicott, M.D.  DATE OF BIRTH:  Jan 18, 1951  DATE OF PROCEDURE:  07/05/2015 DATE OF DISCHARGE:                              OPERATIVE REPORT   PREOPERATIVE DIAGNOSIS:  End-stage left shoulder osteoarthritis.  POSTOPERATIVE DIAGNOSIS:  End-stage left shoulder osteoarthritis.  PROCEDURE:  Left total shoulder arthroplasty utilizing a press-fit size 9 Arthrex stem with a 44 x 17 Eccentric head and a cemented small glenoid.  SURGEON:  Vania Rea. Nataleigh Griffin, M.D.  Threasa HeadsFrench Ana A. Shuford, PA-C.  ANESTHESIA:  General endotracheal as well as an interscalene block.  ESTIMATED BLOOD LOSS:  200 mL.  DRAINS:  None.  HISTORY:  Ms. Altamura is a 65 year old female, who has had chronic and progressive increasing left shoulder pain related to end-stage osteoarthritis.  Due to her increasing pain and functional limitations, she was brought to the operating room at this time for planned left total shoulder arthroplasty.  Preoperatively, I counseled Ms. Maxfield regarding treatment options and potential risks versus benefits thereof.  Possible surgical complications were reviewed including bleeding, infection, neurovascular injury, persistent pain, loss of motion, anesthetic complication, failure of the implant possible need for additional surgery.  She understands and accepts and agrees with our planned procedure.  PROCEDURE IN DETAIL:  After undergoing routine preop evaluation, the patient received prophylactic antibiotics.  An interscalene block was established in the holding area by the Anesthesia Department.  Placed supine on the operating table.  Underwent smooth induction of a general endotracheal anesthesia.  Placed in beach-chair position and appropriately padded and protected.  The right shoulder girdle region was  then sterilely prepped and draped in standard fashion.  Time-out was called.  An anterior deltopectoral approach left shoulder was made through a 10 cm incision.  Skin flaps were elevated.  Dissection carried deeply.  Electrocautery was used for hemostasis.  The deltopectoral interval was unidentified.  Cephalic vein taken laterally.  This was opened from proximal to distal in the upper centimeter and a half of the pectoralis major tendon was tenotomized to enhance exposure.  We then mobilized the conjoined tendon, retracted this medially.  Divided adhesions beneath the deltoid.  Placed our self-retaining retractors. The biceps tendon was then unroofed and tenotomized for later tenodesis and then split the rotator cuff along the rotator interval the base of the coracoid and separated the subscapularis away from the lesser tuberosity leaving a 1 cm cuff of tissue for later repair and the free margin was then tagged with a series of #2 FiberWire suture in a grasping technique.  We then divided the capsule tissues from the anteroinferior and inferior aspects of the humeral head allowing complete delivery of that into the wound.  We used the extramedullary guide to outline our proposed humeral head resection, which we performed with an oscillating saw and care taken to protect the surrounding rotator cuff and soft tissues.  I then removed the osteophytes on the anterior and inferior margins of the metaphysis of the proximal humerus. We then prepared the humerus by hand reaming up to  size 6 and then broaching, beginning at 5 up to a size 9, maintaining the native degree retroversion, which was approximately 30 degrees.  The size 9 broach showed excellent purchase.  At this point, we then replaced a trial size 8 stem into the humerus with a metal cap and then turned our attention to the glenoid, which we exposed combination of Fukuda, pitchfork, and snake tongue retractors.  I mobilized the  subscapularis dividing the capsular tissues anteriorly and confirmed there was good elasticity.  We then performed a circumferential labral resection, removing the proximal stump of the biceps and all appropriate labral tissues.  We gained complete visualization in the entire circumference of the glenoid.  This was sized with the small glenoid showing the best coverage and fit.  A central guide pin was placed and then we performed a reaming of the glenoid to a stable subchondral bony bed.  We then placed our central drill hole.  We then used appropriate guide to place our superior and inferior fixation PEG holes.  We then broached with the appropriate broach for our trial implant.  The trial glenoid was placed showing excellent fit and fixation.  The glenoid was then copiously irrigated, cleaned and dried.  We mixed cement and introduced cement into the superior and inferior hole in the slots respectively and impacted our final size small glenoid with excellent fit and fixation.  At this point, we then returned our attention to the proximal humerus, where we irrigated the humeral canal.  We then placed size 9 humeral stem, impacted this to the appropriate level, and then performed the appropriate tightening of the proximal portion, achieving version and rotation and once these were terminally tightened, we then placed a trial humeral head and the 44 x 17 eccentric head showed excellent soft tissue balance with 50% translation on the glenoid and overall soft tissue balance and stability was much to our satisfaction.  We then removed the trial.  We cleaned the [facility] taper.  We impacted our final 44 x 17 eccentric head with proper rotation and position impacted this, performed a final reduction again showing excellent soft tissue balance, good stability, and good motion.  At this point, we then repaired the subscapularis back to the lesser tuberosity through the soft tissue cuff using the #2  FiberWire.  We repaired the rotator interval with a pair of figure-of-eight number of #2 FiberWire sutures.  And then performed a biceps tenodesis at the upper border of the pectoralis major using #2 FiberWire.  The shoulder was then taken through range of motion showing good motion and good stability easily achieving 45 degrees of external rotation without excessive tension on the subscap repair.  The wound was again was irrigated.  Hemostasis was obtained.  The deltopectoral interval was then reapproximated with a series of figure-of-eight #1 Vicryl sutures.  A 2-0 Vicryl used for the subcu layer, intracuticular 3- 0 Monocryl for the skin.  Followed by Dermabond and Aquacel dressing. Left arm was placed into a sling.  The patient was awakened, extubated, and taken to recovery room in stable condition.  Tracy A. Shuford, PA-C was used as an Geophysicist/field seismologist throughout this case and essential for help with positioning the patient, positioning the extremity, tissue manipulation and retraction, implantation of prosthesis, wound closure, and intraoperative decision making.     Vania Rea. Shannan Slinker, M.D.     KMS/MEDQ  D:  07/05/2015  T:  07/05/2015  Job:  [id number]

## 2015-07-05 NOTE — Anesthesia Procedure Notes (Addendum)
Anesthesia Regional Block:  Interscalene brachial plexus block  Pre-Anesthetic Checklist: ,, timeout performed, Correct Patient, Correct Site, Correct Laterality, Correct Procedure, Correct Position, site marked, Risks and benefits discussed,  Surgical consent,  Pre-op evaluation,  At surgeon's request and post-op pain management  Laterality: Left  Prep: chloraprep       Needles:  Injection technique: Single-shot  Needle Type: Echogenic Stimulator Needle     Needle Length: 9cm 9 cm Needle Gauge: 21 and 21 G    Additional Needles:  Procedures: ultrasound guided (picture in chart) and nerve stimulator Interscalene brachial plexus block  Nerve Stimulator or Paresthesia:  Response: 0.4 mA,   Additional Responses:   Narrative:  Start time: 07/05/2015 8:55 AM End time: 07/05/2015 9:05 AM Injection made incrementally with aspirations every 5 mL.  Performed by: Personally  Anesthesiologist: Arta Bruce  Additional Notes: Monitors applied. Patient sedated. Sterile prep and drape,hand hygiene and sterile gloves were used. Relevant anatomy identified.Needle position confirmed.Local anesthetic injected incrementally after negative aspiration. Local anesthetic spread visualized around nerve(s). Vascular puncture avoided. No complications. Image printed for medical record.The patient tolerated the procedure well.        Procedure Name: Intubation Date/Time: 07/05/2015 10:40 AM Performed by: Glo Herring B Pre-anesthesia Checklist: Patient identified, Emergency Drugs available, Suction available, Timeout performed and Patient being monitored Patient Re-evaluated:Patient Re-evaluated prior to inductionOxygen Delivery Method: Circle system utilized Preoxygenation: Pre-oxygenation with 100% oxygen Intubation Type: IV induction Ventilation: Mask ventilation without difficulty and Oral airway inserted - appropriate to patient size Laryngoscope Size: Mac and 3 Grade View: Grade I Tube  type: Oral Tube size: 7.0 mm Airway Equipment and Method: Stylet Placement Confirmation: ETT inserted through vocal cords under direct vision,  CO2 detector,  positive ETCO2 and breath sounds checked- equal and bilateral Secured at: 21 cm Tube secured with: Tape Dental Injury: Teeth and Oropharynx as per pre-operative assessment

## 2015-07-05 NOTE — Op Note (Signed)
07/05/2015  12:07 PM  PATIENT:   [name]  65 y.o. female  PRE-OPERATIVE DIAGNOSIS:  LEFT SHOULDER OSTEOARTHRITIS  POST-OPERATIVE DIAGNOSIS:  same  PROCEDURE:  L TSA #9 stem, 44x17 head, small glenoid  SURGEON:  Mylan Lengyel, Vania Rea M.D.  ASSISTANTS: Shuford pac   ANESTHESIA:   GET + ISB  EBL: 200  SPECIMEN:  none  Drains: none   PATIENT DISPOSITION:  PACU - hemodynamically stable.    PLAN OF CARE: Admit for overnight observation  Dictation# [id number]   Contact # [phone]

## 2015-07-05 NOTE — Progress Notes (Signed)
Utilization review completed. Kaysen Deal, RN, BSN. 

## 2015-07-06 ENCOUNTER — Encounter (HOSPITAL_COMMUNITY): Payer: Self-pay | Admitting: Orthopedic Surgery

## 2015-07-06 MED ORDER — OXYCODONE-ACETAMINOPHEN 5-325 MG PO TABS: 1.0000 | ORAL_TABLET | ORAL | Status: AC | PRN

## 2015-07-06 MED ORDER — DIAZEPAM 2 MG PO TABS: 2.0000 mg | ORAL_TABLET | Freq: Four times a day (QID) | ORAL | Status: AC | PRN

## 2015-07-06 MED ORDER — ONDANSETRON HCL 4 MG PO TABS: 4.0000 mg | ORAL_TABLET | Freq: Three times a day (TID) | ORAL | Status: AC | PRN

## 2015-07-06 NOTE — Discharge Summary (Signed)
PATIENT ID:      Dawn Gross  MRN:     [id number] DOB/AGE:    1950-08-19 / 65 y.o.     DISCHARGE SUMMARY  ADMISSION DATE:    07/05/2015 DISCHARGE DATE:    ADMISSION DIAGNOSIS: LEFT SHOULDER OSTEOARTHRITIS Past Medical History  Diagnosis Date  . Hypertension     takes Lisinopril daily  . Peripheral edema     takes Lasix daily as needed  . Cataract     immature, right  . Hyperlipidemia     takes Fish Oil daily  . Joint pain   . Joint swelling   . Chronic lower back pain   . Peripheral neuropathy (HCC)     no meds  . Nocturia   . Depression     hx of-no meds  . Headache     "weekly" (07/05/2015)  . Arthritis     "all over"    DISCHARGE DIAGNOSIS:   Active Problems:   S/P shoulder replacement   PROCEDURE: Procedure(s): LEFT TOTAL SHOULDER ARTHROPLASTY on 07/05/2015  CONSULTS:     HISTORY:  See H&P in chart.  HOSPITAL COURSE:  Dawn Gross is a 65 y.o. admitted on 07/05/2015 with a diagnosis of LEFT SHOULDER OSTEOARTHRITIS.  They were brought to the operating room on 07/05/2015 and underwent Procedure(s): LEFT TOTAL SHOULDER ARTHROPLASTY.    They were given perioperative antibiotics: Anti-infectives    Start     Dose/Rate Route Frequency Ordered Stop   07/05/15 1645  ceFAZolin (ANCEF) IVPB 2 g/50 mL premix     2 g 100 mL/hr over 30 Minutes Intravenous Every 6 hours 07/05/15 1544 07/06/15 0658   07/05/15 1000  ceFAZolin (ANCEF) 3 g in dextrose 5 % 50 mL IVPB     3 g 130 mL/hr over 30 Minutes Intravenous To ShortStay Surgical 07/04/15 1040 07/05/15 1048    .  Patient underwent the above named procedure and tolerated it well. The following day they were hemodynamically stable and pain was controlled on oral analgesics. They were neurovascularly intact to the operative extremity. OT was ordered and worked with patient per protocol. They were medically and orthopaedically stable for discharge on day 1 or 2 pending bed search  Patient had needs for skilled  assistance due to her home situation so bed search was ordered and pending for short term admission  DIAGNOSTIC STUDIES:  RECENT RADIOGRAPHIC STUDIES :  No results found.  RECENT VITAL SIGNS:  Patient Vitals for the past 24 hrs:  BP Temp Temp src Pulse Resp SpO2  07/06/15 0500 (!) 110/56 mmHg 98.1 F (36.7 C) Oral 83 18 100 %  07/05/15 2300 (!) 92/50 mmHg 97.8 F (36.6 C) Oral (!) 106 18 100 %  07/05/15 2100 (!) 93/54 mmHg 97.7 F (36.5 C) Oral (!) 108 17 100 %  07/05/15 1540 116/62 mmHg 98.5 F (36.9 C) Oral 91 17 100 %  07/05/15 1521 (!) 154/97 mmHg 98 F (36.7 C) - 95 18 100 %  07/05/15 1500 (!) 133/96 mmHg - - 93 (!) 26 97 %  07/05/15 1430 (!) 163/97 mmHg - - 84 (!) 21 98 %  07/05/15 1400 (!) 168/98 mmHg - - 86 (!) 22 100 %  07/05/15 1330 (!) 168/98 mmHg - - 88 (!) 24 99 %  07/05/15 1315 (!) 130/92 mmHg - - 92 (!) 24 100 %  07/05/15 1300 (!) 159/84 mmHg - - 92 19 95 %  07/05/15 1238 125/70 mmHg 97.2 F (36.2 C) -  90 (!) 30 94 %  .  RECENT EKG RESULTS:    Orders placed or performed in visit on 06/25/15  . EKG 12-Lead    DISCHARGE INSTRUCTIONS:  Discharge Instructions    Discontinue IV    Complete by:  As directed            DISCHARGE MEDICATIONS:     Medication List    TAKE these medications        B-12 PO  Take 1 tablet by mouth daily.     diazepam 2 MG tablet  Commonly known as:  VALIUM  Take 1 tablet (2 mg total) by mouth every 6 (six) hours as needed for muscle spasms or sedation.     diclofenac 75 MG EC tablet  Commonly known as:  VOLTAREN  Take 75 mg by mouth 2 (two) times daily.     ergocalciferol 50000 units capsule  Commonly known as:  VITAMIN D2  Take 50,000 Units by mouth once a week. Saturday.     Fish Oil 600 MG Caps  Take 600 mg by mouth daily.     furosemide 20 MG tablet  Commonly known as:  LASIX  Take 20 mg by mouth daily as needed for edema.     HYDROcodone-acetaminophen 10-325 MG tablet  Commonly known as:  NORCO  Take 1  tablet by mouth every 4 (four) hours as needed for moderate pain.     lisinopril 20 MG tablet  Commonly known as:  PRINIVIL,ZESTRIL  Take 20 mg by mouth daily.     Magnesium 250 MG Tabs  Take 250 mg by mouth daily.     ondansetron 4 MG tablet  Commonly known as:  ZOFRAN  Take 1 tablet (4 mg total) by mouth every 8 (eight) hours as needed for nausea or vomiting.     oxyCODONE-acetaminophen 5-325 MG tablet  Commonly known as:  PERCOCET  Take 1-2 tablets by mouth every 4 (four) hours as needed.     potassium chloride 10 MEQ tablet  Commonly known as:  K-DUR  Take 10 mEq by mouth daily as needed (edema). Take with lasix        FOLLOW UP VISIT:       Follow-up Information    Follow up with Vania Rea SUPPLE, MD.   Specialty:  Orthopedic Surgery   Contact information:   [address] [place] [address] [phone]       DISCHARGE TO: Skilled  DISPOSITION: Good  DISCHARGE CONDITION:  Rodolph Bong for Dr. Francena Hanly 07/06/2015, 10:22 AM

## 2015-07-06 NOTE — Evaluation (Signed)
Physical Therapy Evaluation Patient Details Name: Dawn Gross MRN: [id number] DOB: 08/29/1950 Today's Date: 07/06/2015   History of Present Illness  65 y.o. 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 PMH: hypertension, cataract, pheriperal edema, chronic low back pain, neuropathy, depression, THA bilateral  Clinical Impression  Patient is s/p above surgery resulting in functional limitations due to the deficits listed below (see PT Problem List).  Patient will benefit from skilled PT to increase their independence and safety with mobility to allow discharge to the venue listed below.  Anticipate patient will D/C to SNF for further rehabilitation prior to returning home alone. The patient did demonstrate decreased gait stability and distance during evaluation. Patient denies any questions or concerns.       Follow Up Recommendations SNF;Supervision for mobility/OOB    Equipment Recommendations  None recommended by PT    Recommendations for Other Services       Precautions / Restrictions Precautions Precautions: Fall Required Braces or Orthoses: Sling Restrictions Weight Bearing Restrictions: Yes LUE Weight Bearing: Non weight bearing      Mobility  Bed Mobility               General bed mobility comments: up in chair upon arrival  Transfers Overall transfer level: Needs assistance Equipment used: None Transfers: Sit to/from Stand Sit to Stand: Min guard         General transfer comment: min guard due to instability with initial stand.   Ambulation/Gait Ambulation/Gait assistance: Min guard Ambulation Distance (Feet): 30 Feet Assistive device: Straight cane   Gait velocity: decreased   General Gait Details: Pt with a wide base of support and gereral unsteady pattern with decreased weight shifts which the patient states is due to her previous knee replacements. No gross loss of balance.   Stairs            Wheelchair Mobility     Modified Rankin (Stroke Patients Only)       Balance Overall balance assessment: Needs assistance Sitting-balance support: No upper extremity supported Sitting balance-Leahy Scale: Good     Standing balance support: Single extremity supported Standing balance-Leahy Scale: Poor Standing balance comment: using SPC for stability                             Pertinent Vitals/Pain Pain Assessment: 0-10 Pain Score: 3  Pain Location: lt shoulder Pain Descriptors / Indicators: Aching Pain Intervention(s): Limited activity within patient's tolerance;Monitored during session    Home Living Family/patient expects to be discharged to:: Skilled nursing facility Living Arrangements: Alone               Additional Comments: Pt reports having 1 step to enter her home with bilateral rails.     Prior Function Level of Independence: Independent with assistive device(s)         Comments: using SPC     Hand Dominance        Extremity/Trunk Assessment   Upper Extremity Assessment: Defer to OT evaluation           Lower Extremity Assessment: Generalized weakness         Communication   Communication: No difficulties  Cognition Arousal/Alertness: Awake/alert Behavior During Therapy: WFL for tasks assessed/performed Overall Cognitive Status: Within Functional Limits for tasks assessed                      General Comments  Exercises        Assessment/Plan    PT Assessment Patient needs continued PT services  PT Diagnosis Difficulty walking   PT Problem List Decreased strength;Decreased activity tolerance;Decreased balance;Decreased mobility  PT Treatment Interventions DME instruction;Gait training;Stair training;Functional mobility training;Therapeutic activities;Therapeutic exercise   PT Goals (Current goals can be found in the Care Plan section) Acute Rehab PT Goals Patient Stated Goal: go for more therapy before going home.  PT  Goal Formulation: With patient Time For Goal Achievement: 07/20/15 Potential to Achieve Goals: Good    Frequency Min 3X/week   Barriers to discharge Decreased caregiver support      Co-evaluation               End of Session Equipment Utilized During Treatment: Gait belt Activity Tolerance: Patient tolerated treatment well Patient left: in chair;with call bell/phone within reach;Other (comment) (LLE supported with pillows) Nurse Communication: Mobility status    Functional Assessment Tool Used: clinical judgment Functional Limitation: Mobility: Walking and moving around Mobility: Walking and Moving Around Current Status (J1914): At least 20 percent but less than 40 percent impaired, limited or restricted Mobility: Walking and Moving Around Goal Status [phone]): At least 1 percent but less than 20 percent impaired, limited or restricted    Time: 1011-1028 PT Time Calculation (min) (ACUTE ONLY): 17 min   Charges:   PT Evaluation $PT Eval Low Complexity: 1 Procedure     PT G Codes:   PT G-Codes **NOT FOR INPATIENT CLASS** Functional Assessment Tool Used: clinical judgment Functional Limitation: Mobility: Walking and moving around Mobility: Walking and Moving Around Current Status (A2130): At least 20 percent but less than 40 percent impaired, limited or restricted Mobility: Walking and Moving Around Goal Status [phone]): At least 1 percent but less than 20 percent impaired, limited or restricted    Christiane Ha, PT, CSCS Pager [phone] Office [phone]  07/06/2015, 10:42 AM

## 2015-07-06 NOTE — Progress Notes (Addendum)
Occupational Therapy Evaluation Patient Details Name: Dawn Gross MRN: [id number] DOB: 1950-12-23 Today's Date: 07/06/2015    History of Present Illness 65 y.o. 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 PMH: hypertension, cataract, pheriperal edema, chronic low back pain, neuropathy, depression, THA bilateral   Clinical Impression   PTA, pt lived alone and was Mod I with mobility and and ADL. Pt presents with below deficits and has limited assistance after D/C. Pt will benefit from rehab at SNF to facilitate safe return to PLOF and independent living. Began education on compensatory techniques and LUE exercises per protocol as noted below. Written handouts given. Pt painful, but tolerated well. Will plan to follow acutely to address established goals. Very appreciative.     Follow Up Recommendations  Supervision/Assistance - 24 hour;SNF    Equipment Recommendations  Other (comment) (TBA at SNF)    Recommendations for Other Services       Precautions / Restrictions Precautions Precautions: Fall;Shoulder Type of Shoulder Precautions: passive; FF 0-90; Abd 0-45; ER 0-30; Pendulums OK; AROM elbow wrist hand Shoulder Interventions: Shoulder sling/immobilizer;Off for dressing/bathing/exercises;At all times (can remove if sitting watching TV/eating) Precaution Booklet Issued: Yes (comment) Required Braces or Orthoses: Sling Restrictions Weight Bearing Restrictions: Yes LUE Weight Bearing: Non weight bearing      Mobility Bed Mobility Overal bed mobility: Needs Assistance Bed Mobility: Sit to Supine       Sit to supine: Supervision      Transfers Overall transfer level: Needs assistance Equipment used: None   Sit to Stand: Min guard         General transfer comment: min guard due to instability with initial stand.     Balance Overall balance assessment: Needs assistance   Sitting balance-Leahy Scale: Good       Standing balance-Leahy Scale:  Fair Standing balance comment: using SPC                            ADL Overall ADL's : Needs assistance/impaired     Grooming: Moderate assistance;Sitting   Upper Body Bathing: Moderate assistance;Sitting   Lower Body Bathing: Moderate assistance;Sit to/from stand   Upper Body Dressing : Moderate assistance;Sitting   Lower Body Dressing: Moderate assistance;Sit to/from stand   Toilet Transfer: Solicitor;Ambulation   Toileting- Clothing Manipulation and Hygiene: Minimal assistance;Sitting/lateral lean       Functional mobility during ADLs: Min guard General ADL Comments: Began education regarding compensatory technqiues for ADL and sling management. Educated o positioning in siting and in bed.     Vision     Perception     Praxis      Pertinent Vitals/Pain Pain Assessment: 0-10 Pain Score: 5  Pain Location: L shoulder Pain Descriptors / Indicators: Aching;Discomfort Pain Intervention(s): Limited activity within patient's tolerance;Repositioned;Ice applied     Hand Dominance Right   Extremity/Trunk Assessment Upper Extremity Assessment Upper Extremity Assessment: LUE deficits/detail LUE Deficits / Details: Elbow wrist hand AROM WFL. Able to achieve @ 0-30 FF; 0-20 Abd and 0 - -20 ER. Pt able to dangle. LUE Coordination: decreased gross motor   Lower Extremity Assessment Lower Extremity Assessment: Defer to PT evaluation   Cervical / Trunk Assessment Cervical / Trunk Assessment: Normal   Communication Communication Communication: No difficulties   Cognition Arousal/Alertness: Awake/alert Behavior During Therapy: WFL for tasks assessed/performed Overall Cognitive Status: Within Functional Limits for tasks assessed  General Comments       Exercises Exercises: Shoulder     Shoulder Instructions Shoulder Instructions Donning/doffing shirt without moving shoulder: Moderate assistance Method for  sponge bathing under operated UE: Moderate assistance Donning/doffing sling/immobilizer: Moderate assistance Correct positioning of sling/immobilizer: Moderate assistance Pendulum exercises (written home exercise program): Supervision/safety ROM for elbow, wrist and digits of operated UE: Supervision/safety Sling wearing schedule (on at all times/off for ADL's): Supervision/safety Proper positioning of operated UE when showering: Minimal assistance Positioning of UE while sleeping: Minimal assistance    Home Living Family/patient expects to be discharged to:: Skilled nursing facility Living Arrangements: Alone                               Additional Comments: Pt reports having 1 step to enter her home with bilateral rails.       Prior Functioning/Environment Level of Independence: Independent with assistive device(s)        Comments: using SPC    OT Diagnosis: Generalized weakness;Acute pain   OT Problem List: Decreased strength;Decreased range of motion;Decreased activity tolerance;Impaired balance (sitting and/or standing);Decreased knowledge of use of DME or AE;Decreased knowledge of precautions;Obesity;Impaired UE functional use;Pain   OT Treatment/Interventions: Self-care/ADL training;Therapeutic exercise;DME and/or AE instruction;Therapeutic activities;Balance training;Patient/family education    OT Goals(Current goals can be found in the care plan section) Acute Rehab OT Goals Patient Stated Goal: go for more therapy before going home.  OT Goal Formulation: With patient Time For Goal Achievement: 07/13/15 Potential to Achieve Goals: Good  OT Frequency: Min 3X/week   Barriers to D/C: Decreased caregiver support          Co-evaluation              End of Session Nurse Communication: Mobility status;Precautions;Weight bearing status  Activity Tolerance: Patient tolerated treatment well Patient left: in bed;with call bell/phone within reach;with  SCD's reapplied   Time: 1135-1204 OT Time Calculation (min): 29 min Charges:  OT General Charges $OT Visit: 1 Procedure OT Evaluation $OT Eval Moderate Complexity: 1 Procedure OT Treatments $Therapeutic Activity: 8-22 mins G-Codes:    Timm Bonenberger,HILLARY 17-Jul-2015, 2:51 PM   [facility], OTR/L  [phone] 07-17-2015

## 2015-07-06 NOTE — NC FL2 (Signed)
[place] MEDICAID FL2 LEVEL OF CARE SCREENING TOOL     IDENTIFICATION  Patient Name: Dawn Gross Birthdate: [birth year] Sex: female Admission Date (Current Location): 07/05/2015  [facility] and [place] Number:  [employer] and Address:  [facility], [address] [place], [place] [address]      Provider Number: [id number]  Attending Physician Name and Address:  Francena Hanly, MD  Relative Name and Phone Number:  Jaquilla, [place] [phone]    Current Level of Care: Hospital Recommended Level of Care: Skilled Nursing Facility Prior Approval Number:    Date Approved/Denied:   PASRR Number: [id number]  Discharge Plan: SNF    Current Diagnoses: Patient Active Problem List   Diagnosis Date Noted  . 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 Lumbar radiculopathy, chronic 01/16/2015    Orientation RESPIRATION BLADDER Height & Weight     Self, Time, Situation, Place  O2 (2 L) Continent Weight: 277 lb 3.2 oz (125.737 kg) Height:   (157.5 cm)  BEHAVIORAL SYMPTOMS/MOOD NEUROLOGICAL BOWEL NUTRITION STATUS      Continent Diet (Regular)  AMBULATORY STATUS COMMUNICATION OF NEEDS Skin   Limited Assist Verbally Surgical wounds                       Personal Care Assistance Level of Assistance  Bathing, Dressing Bathing Assistance: Limited assistance   Dressing Assistance: Limited assistance     Functional Limitations Info             SPECIAL CARE FACTORS FREQUENCY  PT (By licensed PT), OT (By licensed OT)     PT Frequency: 5x OT Frequency: 5x            Contractures      Additional Factors Info  Code Status, Allergies Code Status Info: Prior Allergies Info: NKA           Current Medications (07/06/2015):  This is the current hospital active medication list Current Facility-Administered Medications  Medication Dose Route Frequency Provider Last Rate Last Dose  . acetaminophen (TYLENOL) tablet 650 mg   650 mg Oral Q6H PRN Ralene Bathe, PA-C       Or  . acetaminophen (TYLENOL) suppository 650 mg  650 mg Rectal Q6H PRN Ralene Bathe, PA-C      . alum & mag hydroxide-simeth (MAALOX/MYLANTA) 200-200-20 MG/5ML suspension 30 mL  30 mL Oral Q4H PRN Tracy Shuford, PA-C      . bisacodyl (DULCOLAX) EC tablet 5 mg  5 mg Oral Daily PRN Ralene Bathe, PA-C   5 mg at 07/06/15 0905  . diazepam (VALIUM) tablet 5 mg  5 mg Oral Q6H PRN Ralene Bathe, PA-C   5 mg at 07/06/15 4010  . diphenhydrAMINE (BENADRYL) 12.5 MG/5ML elixir 12.5-25 mg  12.5-25 mg Oral Q4H PRN Tracy Shuford, PA-C      . docusate sodium (COLACE) capsule 100 mg  100 mg Oral BID Ralene Bathe, PA-C   100 mg at 07/06/15 0905  . HYDROmorphone (DILAUDID) injection 1 mg  1 mg Intravenous Q2H PRN French Ana Shuford, PA-C      . lactated ringers infusion   Intravenous Continuous Tracy Shuford, PA-C      . lisinopril (PRINIVIL,ZESTRIL) tablet 20 mg  20 mg Oral Daily Tracy Shuford, PA-C   20 mg at 07/06/15 0905  . menthol-cetylpyridinium (CEPACOL) lozenge 3 mg  1 lozenge Oral PRN French Ana Shuford, PA-C       Or  . phenol (CHLORASEPTIC)  mouth spray 1 spray  1 spray Mouth/Throat PRN Ralene Bathe, PA-C      . metoCLOPramide (REGLAN) tablet 5-10 mg  5-10 mg Oral Q8H PRN Tracy Shuford, PA-C       Or  . metoCLOPramide (REGLAN) injection 5-10 mg  5-10 mg Intravenous Q8H PRN Tracy Shuford, PA-C      . ondansetron (ZOFRAN) tablet 4 mg  4 mg Oral Q6H PRN Tracy Shuford, PA-C       Or  . ondansetron (ZOFRAN) injection 4 mg  4 mg Intravenous Q6H PRN Tracy Shuford, PA-C      . oxyCODONE (Oxy IR/ROXICODONE) immediate release tablet 5-10 mg  5-10 mg Oral Q3H PRN Ralene Bathe, PA-C   5 mg at 07/05/15 2231  . polyethylene glycol (MIRALAX / GLYCOLAX) packet 17 g  17 g Oral Daily PRN French Ana Shuford, PA-C      . sodium phosphate (FLEET) 7-19 GM/118ML enema 1 enema  1 enema Rectal Once PRN Ralene Bathe, PA-C         Discharge Medications: Please see discharge summary for a  list of discharge medications.  Relevant Imaging Results:  Relevant Lab Results:   Additional Information SSN [id number]  Darleene Cleaver, [place]

## 2015-07-06 NOTE — Progress Notes (Signed)
   07/06/15 1100  Clinical Encounter Type  Visited With Patient;Health care provider  Visit Type Initial  Referral From Care management  Consult/Referral To Faith community  Spiritual Encounters  Spiritual Needs Literature  Stress Factors  Patient Stress Factors Exhausted   Chaplain helped the completion of  AD

## 2015-07-06 NOTE — Progress Notes (Signed)
Pt discharge education and instructions complete with pt and family at bedside. Pt discharge to [facility] nursing center with family to transport her to disposition. Report called off to RN Sherrilyn Rist at the facility; pt IV removed; LUE incision dsg remains clean, dry and intact; sling on; and neuro check wnl. Pt handed her prescriptions for percocet; zofran and valium. Pt transported off unit via wheelchair with family and belongings to the side. Dionne Bucy RN

## 2015-07-06 NOTE — Discharge Instructions (Signed)

## 2015-07-06 NOTE — Clinical Social Work Note (Signed)
Clinical Social Work Assessment  Patient Details  Name: Dawn Gross MRN: [id number] Date of Birth: 1950/12/13  Date of referral:  07/06/15               Reason for consult:  Facility Placement                Permission sought to share information with:  Facility Industrial/product designer granted to share information::  Yes, Verbal Permission Granted  Name::        Agency::  SNF admissions  Relationship::     Contact Information:     Housing/Transportation Living arrangements for the past 2 months:  Single Family Home Source of Information:  Patient Patient Interpreter Needed:  None Criminal Activity/Legal Involvement Pertinent to Current Situation/Hospitalization:  No - Comment as needed Significant Relationships:  Siblings Lives with:  Self Do you feel safe going back to the place where you live?  No (Patient feel she needs some short term rehab in order to return back home.) Need for family participation in patient care:  No (Coment)  Care giving concerns:  Patient feel she needs some short term rehab before she can return back home.   Social Worker assessment / plan:  Patient is a 65 year old female who lives alone.  Patient is alert and oriented x4 and able to make decisions on her own.  Patient expressed that she has been to rehab in the past and would like to go to [facility].  Patient expressed that she was at a different facility in the past in [place], but would like to go to SNF in [place] because she lives in the area.  CSW explained role to patient and process for SNF placement.  Patient did not express any other concerns or issues and did not have any other questions.  Employment status:  Retired Database administrator PT Recommendations:  Skilled Nursing Facility Information / Referral to community resources:  Skilled Nursing Facility  Patient/Family's Response to care:  Patient agreeable to going to SNF for  rehab.  Patient/Family's Understanding of and Emotional Response to Diagnosis, Current Treatment, and Prognosis:  Patient aware of current diagnosis and treatment plan.  Emotional Assessment Appearance:  Appears stated age Attitude/Demeanor/Rapport:    Affect (typically observed):  Appropriate, Calm, Pleasant Orientation:  Oriented to Self, Oriented to Place, Oriented to  Time, Oriented to Situation Alcohol / Substance use:  Not Applicable Psych involvement (Current and /or in the community):  No (Comment)  Discharge Needs  Concerns to be addressed:  No discharge needs identified Readmission within the last 30 days:  No Current discharge risk:  None Barriers to Discharge:  No Barriers Identified   Darleene Cleaver, LCSWA 07/06/2015, 4:21 PM

## 2015-07-06 NOTE — Clinical Social Work Note (Addendum)
CSW spoke with patient and presented bed offers, patient chose [facility]  CSW contacted [facility] who said they can take patient today.  Patient to be d/c'ed today to [facility].  Patient and family agreeable to plans will transport via family car RN to call report to [phone].   Windell Moulding, MSW, Theresia Majors [phone]

## 2015-07-06 NOTE — Clinical Social Work Placement (Signed)
   CLINICAL SOCIAL WORK PLACEMENT  NOTE  Date:  07/06/2015  Patient Details  Name: ANGLIA BLAKLEY MRN: [id number] Date of Birth: 23-Sep-1950  Clinical Social Work is seeking post-discharge placement for this patient at the Skilled  Nursing Facility level of care (*CSW will initial, date and re-position this form in  chart as items are completed):  Yes   Patient/family provided with [facility] Clinical Social Work Department's list of facilities offering this level of care within the geographic area requested by the patient (or if unable, by the patient's family).  Yes   Patient/family informed of their freedom to choose among providers that offer the needed level of care, that participate in Medicare, Medicaid or managed care program needed by the patient, have an available bed and are willing to accept the patient.  Yes   Patient/family informed of [facility]'s ownership interest in [facility] and [facility], as well as of the fact that they are under no obligation to receive care at these facilities.  PASRR submitted to EDS on 07/06/15     PASRR number received on       Existing PASRR number confirmed on 07/06/15     FL2 transmitted to all facilities in geographic area requested by pt/family on 07/06/15     FL2 transmitted to all facilities within larger geographic area on       Patient informed that his/her managed care company has contracts with or will negotiate with certain facilities, including the following:        Yes   Patient/family informed of bed offers received.  Patient chooses bed at [facility]     Physician recommends and patient chooses bed at      Patient to be transferred to [facility] on 07/06/15.  Patient to be transferred to facility by Personal vehicle     Patient family notified on   of transfer.  Name of family member notified:  Patient notified her family herself     PHYSICIAN       Additional Comment:     _______________________________________________ Darleene Cleaver, LCSWA 07/06/2015, 5:14 PM

## 2015-07-11 ENCOUNTER — Encounter (HOSPITAL_COMMUNITY): Payer: Self-pay | Admitting: Orthopedic Surgery

## 2015-07-17 NOTE — Anesthesia Postprocedure Evaluation (Signed)
Anesthesia Post Note  Patient: Dawn Gross  Procedure(s) Performed: Procedure(s) (LRB): LEFT TOTAL SHOULDER ARTHROPLASTY (Left)  Patient location during evaluation: PACU Level of consciousness: awake Pain management: pain level controlled Vital Signs Assessment: post-procedure vital signs reviewed and stable Respiratory status: spontaneous breathing Cardiovascular status: stable Anesthetic complications: no    Last Vitals:  Filed Vitals:   07/06/15 1639 07/06/15 1646  BP: 87/69 107/61  Pulse: 97 98  Temp: 37.3 C   Resp: 16 18    Last Pain:  Filed Vitals:   07/06/15 1648  PainSc: 6                  EDWARDS,Trina Asch

## 2015-08-13 DIAGNOSIS — M6281 Muscle weakness (generalized): Secondary | ICD-10-CM | POA: Diagnosis not present

## 2015-08-13 DIAGNOSIS — Z471 Aftercare following joint replacement surgery: Secondary | ICD-10-CM | POA: Diagnosis not present

## 2015-08-13 DIAGNOSIS — M25512 Pain in left shoulder: Secondary | ICD-10-CM | POA: Diagnosis not present

## 2015-08-13 DIAGNOSIS — Z96612 Presence of left artificial shoulder joint: Secondary | ICD-10-CM | POA: Diagnosis not present

## 2015-09-10 DIAGNOSIS — Z96612 Presence of left artificial shoulder joint: Secondary | ICD-10-CM | POA: Diagnosis not present

## 2015-09-10 DIAGNOSIS — M25512 Pain in left shoulder: Secondary | ICD-10-CM | POA: Diagnosis not present

## 2015-09-10 DIAGNOSIS — M6281 Muscle weakness (generalized): Secondary | ICD-10-CM | POA: Diagnosis not present

## 2015-09-10 DIAGNOSIS — Z471 Aftercare following joint replacement surgery: Secondary | ICD-10-CM | POA: Diagnosis not present

## 2015-10-04 DIAGNOSIS — Z79899 Other long term (current) drug therapy: Secondary | ICD-10-CM | POA: Diagnosis not present

## 2015-10-04 DIAGNOSIS — Z6841 Body Mass Index (BMI) 40.0 and over, adult: Secondary | ICD-10-CM | POA: Diagnosis not present

## 2015-10-04 DIAGNOSIS — R5383 Other fatigue: Secondary | ICD-10-CM | POA: Diagnosis not present

## 2015-10-04 DIAGNOSIS — Z299 Encounter for prophylactic measures, unspecified: Secondary | ICD-10-CM | POA: Diagnosis not present

## 2015-10-04 DIAGNOSIS — Z0000 Encounter for general adult medical examination without abnormal findings: Secondary | ICD-10-CM | POA: Diagnosis not present

## 2015-10-04 DIAGNOSIS — Z471 Aftercare following joint replacement surgery: Secondary | ICD-10-CM | POA: Diagnosis not present

## 2015-10-04 DIAGNOSIS — E7800 Pure hypercholesterolemia, unspecified: Secondary | ICD-10-CM | POA: Diagnosis not present

## 2015-10-04 DIAGNOSIS — Z7189 Other specified counseling: Secondary | ICD-10-CM | POA: Diagnosis not present

## 2015-10-04 DIAGNOSIS — E559 Vitamin D deficiency, unspecified: Secondary | ICD-10-CM | POA: Diagnosis not present

## 2015-10-04 DIAGNOSIS — Z96612 Presence of left artificial shoulder joint: Secondary | ICD-10-CM | POA: Diagnosis not present

## 2015-10-04 DIAGNOSIS — Z1211 Encounter for screening for malignant neoplasm of colon: Secondary | ICD-10-CM | POA: Diagnosis not present

## 2015-10-04 DIAGNOSIS — Z1389 Encounter for screening for other disorder: Secondary | ICD-10-CM | POA: Diagnosis not present

## 2015-10-11 DIAGNOSIS — Z96612 Presence of left artificial shoulder joint: Secondary | ICD-10-CM | POA: Diagnosis not present

## 2015-10-11 DIAGNOSIS — M25512 Pain in left shoulder: Secondary | ICD-10-CM | POA: Diagnosis not present

## 2015-10-11 DIAGNOSIS — M6281 Muscle weakness (generalized): Secondary | ICD-10-CM | POA: Diagnosis not present

## 2015-10-11 DIAGNOSIS — Z471 Aftercare following joint replacement surgery: Secondary | ICD-10-CM | POA: Diagnosis not present

## 2015-10-22 ENCOUNTER — Other Ambulatory Visit (HOSPITAL_COMMUNITY): Payer: Self-pay | Admitting: Internal Medicine

## 2015-10-22 DIAGNOSIS — Z1231 Encounter for screening mammogram for malignant neoplasm of breast: Secondary | ICD-10-CM

## 2015-10-29 ENCOUNTER — Ambulatory Visit (HOSPITAL_COMMUNITY)
Admission: RE | Admit: 2015-10-29 | Discharge: 2015-10-29 | Disposition: A | Discharge status: Home / Self Care | Payer: PPO | Source: Ambulatory Visit | Attending: Internal Medicine | Admitting: Internal Medicine

## 2015-10-29 DIAGNOSIS — Z1231 Encounter for screening mammogram for malignant neoplasm of breast: Secondary | ICD-10-CM | POA: Insufficient documentation

## 2015-11-01 DIAGNOSIS — M810 Age-related osteoporosis without current pathological fracture: Secondary | ICD-10-CM | POA: Diagnosis not present

## 2015-12-18 DIAGNOSIS — E7800 Pure hypercholesterolemia, unspecified: Secondary | ICD-10-CM | POA: Diagnosis not present

## 2015-12-18 DIAGNOSIS — I10 Essential (primary) hypertension: Secondary | ICD-10-CM | POA: Diagnosis not present

## 2016-01-09 DIAGNOSIS — Z79899 Other long term (current) drug therapy: Secondary | ICD-10-CM | POA: Diagnosis not present

## 2016-01-09 DIAGNOSIS — E785 Hyperlipidemia, unspecified: Secondary | ICD-10-CM | POA: Diagnosis not present

## 2016-02-04 DIAGNOSIS — E7800 Pure hypercholesterolemia, unspecified: Secondary | ICD-10-CM | POA: Diagnosis not present

## 2016-02-04 DIAGNOSIS — I10 Essential (primary) hypertension: Secondary | ICD-10-CM | POA: Diagnosis not present

## 2016-02-06 DIAGNOSIS — Z471 Aftercare following joint replacement surgery: Secondary | ICD-10-CM | POA: Diagnosis not present

## 2016-02-06 DIAGNOSIS — Z96612 Presence of left artificial shoulder joint: Secondary | ICD-10-CM | POA: Diagnosis not present

## 2016-02-20 ENCOUNTER — Ambulatory Visit (INDEPENDENT_AMBULATORY_CARE_PROVIDER_SITE_OTHER): Payer: PPO | Admitting: Physical Medicine and Rehabilitation

## 2016-02-20 DIAGNOSIS — G609 Hereditary and idiopathic neuropathy, unspecified: Secondary | ICD-10-CM

## 2016-02-20 DIAGNOSIS — M961 Postlaminectomy syndrome, not elsewhere classified: Secondary | ICD-10-CM | POA: Diagnosis not present

## 2016-02-20 DIAGNOSIS — M5416 Radiculopathy, lumbar region: Secondary | ICD-10-CM

## 2016-02-26 ENCOUNTER — Encounter (INDEPENDENT_AMBULATORY_CARE_PROVIDER_SITE_OTHER): Payer: PPO | Admitting: Physical Medicine and Rehabilitation

## 2016-02-26 DIAGNOSIS — M48061 Spinal stenosis, lumbar region without neurogenic claudication: Secondary | ICD-10-CM | POA: Diagnosis not present

## 2016-02-26 DIAGNOSIS — M5416 Radiculopathy, lumbar region: Secondary | ICD-10-CM | POA: Diagnosis not present

## 2016-04-07 ENCOUNTER — Telehealth (INDEPENDENT_AMBULATORY_CARE_PROVIDER_SITE_OTHER): Payer: Self-pay

## 2016-04-07 NOTE — Telephone Encounter (Signed)
If the injection helped more than 50% but I would repeat versus doing the injection at L2 facet of L3. If the injection just did not help very much that her choices are either updating the lumbar spine MRI and/or referral back to Dr. Lovell SheehanJenkins for evaluation.

## 2016-04-07 NOTE — Telephone Encounter (Signed)
Patient left vm requesting another injection. Had Bil L3 TF on 02/26/16. Ok to repeat?

## 2016-04-08 NOTE — Telephone Encounter (Signed)
If right side hurting and left ok then try to pre-auth diagnostic right L3 Transforaminal. If both side hurting now then repeat, pt will need valium pre-procedure

## 2016-04-08 NOTE — Telephone Encounter (Signed)
Left message # 1 for pt to call back to discuss. Pt has [employer] so will require pre auth for another injection.

## 2016-04-09 NOTE — Telephone Encounter (Signed)
Left vm for pt to call back to schedule

## 2016-04-09 NOTE — Telephone Encounter (Signed)
Faxed auth form to [employer] with last 2 office notes from Dr. Alvester MorinNewton. Fax#[phone].

## 2016-04-09 NOTE — Telephone Encounter (Signed)
Spoke with pt and scheduled her for 04/17/16 @ 9:00 w/driver and no blood thinners

## 2016-04-11 ENCOUNTER — Telehealth (INDEPENDENT_AMBULATORY_CARE_PROVIDER_SITE_OTHER): Payer: Self-pay

## 2016-04-11 NOTE — Telephone Encounter (Signed)
Received carrier auth for code [id number] 1 unit good from 04/16/16-10/13/16. Auth #[id number], Pt has already been scheduled.

## 2016-04-17 ENCOUNTER — Encounter (INDEPENDENT_AMBULATORY_CARE_PROVIDER_SITE_OTHER): Payer: Self-pay | Admitting: Physical Medicine and Rehabilitation

## 2016-04-17 ENCOUNTER — Ambulatory Visit (INDEPENDENT_AMBULATORY_CARE_PROVIDER_SITE_OTHER): Payer: PPO | Admitting: Physical Medicine and Rehabilitation

## 2016-04-17 VITALS — BP 130/73 | HR 62 | Temp 98.0°F

## 2016-04-17 DIAGNOSIS — M5416 Radiculopathy, lumbar region: Secondary | ICD-10-CM | POA: Diagnosis not present

## 2016-04-17 MED ORDER — METHYLPREDNISOLONE ACETATE 80 MG/ML IJ SUSP
80.0000 mg | Freq: Once | INTRAMUSCULAR | Status: AC
  Administered 2016-04-17: 80 mg

## 2016-04-17 MED ORDER — LIDOCAINE HCL (PF) 1 % IJ SOLN
0.3300 mL | Freq: Once | INTRAMUSCULAR | Status: AC
  Administered 2016-04-17: 0.3 mL

## 2016-04-17 NOTE — Procedures (Signed)
Lumbosacral Transforaminal Epidural Steroid Injection - Infraneural Approach with Fluoroscopic Guidance  Patient: Dawn Gross      Date of Birth: June 23, 1950 MRN: [id number] PCP: Ignatius Speckinghruv B Vyas, MD      Visit Date: 04/17/2016   Universal Protocol:    Date/Time: 12/07/179:27 AM  Consent Given By: the patient  Position: PRONE   Additional Comments: Vital signs were monitored before and after the procedure. Patient was prepped and draped in the usual sterile fashion. The correct patient, procedure, and site was verified.   Injection Procedure Details:  Procedure Site One Meds Administered:  Meds ordered this encounter  Medications  . lidocaine (PF) (XYLOCAINE) 1 % injection 0.3 mL  . methylPREDNISolone acetate (DEPO-MEDROL) injection 80 mg      Laterality: Right  Location/Site:  L3-L4  Needle size: 22 G  Needle type: Spinal  Needle Placement: Transforaminal  Findings:  -Contrast Used: 1 mL iohexol 180 mg iodine/mL   -Comments: Excellent flow of contrast along the nerve and into the epidural space.  Procedure Details: After squaring off the end-plates of the desired vertebral level to get a true AP view, the C-arm was obliqued to the painful side so that the superior articulating process is positioned about 1/3 the length of the inferior endplate.  The needle was aimed toward the junction of the superior articular process and the transverse process of the inferior vertebrae. The needle's initial entry is in the lower third of the foramen through Kambin's triangle. The soft tissues overlying this target were infiltrated with 2-3 ml. of 1% Lidocaine without Epinephrine.  The spinal needle was then inserted and advanced toward the target using a "trajectory" view along the fluoroscope beam.  Under AP and lateral visualization, the needle was advanced so it did not puncture dura and did not traverse medially beyond the 6 o'clock position of the pedicle. Bi-planar projections  were used to confirm position. Aspiration was confirmed to be negative for CSF and/or blood. A 1-2 ml. volume of Isovue-250 was injected and flow of contrast was noted at each level. Radiographs were obtained for documentation purposes.   After attaining the desired flow of contrast documented above, a 0.5 to 1.0 ml test dose of 0.25% Marcaine was injected into each respective transforaminal space.  The patient was observed for 90 seconds post injection.  After no sensory deficits were reported, and normal lower extremity motor function was noted,   the above injectate was administered so that equal amounts of the injectate were placed at each foramen (level) into the transforaminal epidural space.   Additional Comments:  The patient tolerated the procedure well Dressing: Band-Aid    Post-procedure details: Patient was observed during the procedure. Post-procedure instructions were reviewed.  Patient left the clinic in stable condition.

## 2016-04-17 NOTE — Patient Instructions (Signed)

## 2016-04-17 NOTE — Progress Notes (Signed)
Dawn LandmarkBrenda A Gross - 65 y.o. female MRN [id number]  Date of birth: [birth year]  Office Visit Note: Visit Date: 04/17/2016 PCP: Ignatius Speckinghruv B Vyas, MD Referred by: Ignatius SpeckingVyas, Dhruv B, MD  Subjective: Chief Complaint  Patient presents with  . 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 [facility] for further details. We completed transforaminal injection at L3 to give her 80% relief on the left but not as much relief on the right. We are going to complete a right L3 transforaminal injection diagnostically.   Had relief with last injection on left side around 80% but no relief on right side.Comes and goes. Worse with walking and standing. Relief with sitting. Denies leg pain.    ROS Otherwise per HPI.  Assessment & Plan: Visit Diagnoses:  1. Lumbar radiculopathy     Plan: Findings:  Right L3 transforaminal epidural steroid injection. Please see our prior evaluation and management note for further details and justification.    Meds & Orders:  Meds ordered this encounter  Medications  . lidocaine (PF) (XYLOCAINE) 1 % injection 0.3 mL  . methylPREDNISolone acetate (DEPO-MEDROL) injection 80 mg    Orders Placed This Encounter  Procedures  . Epidural Steroid injection    Follow-up: Return if symptoms worsen or fail to improve after 2 weeks.   Procedures: No procedures performed  Lumbosacral Transforaminal Epidural Steroid Injection - Infraneural Approach with Fluoroscopic Guidance  Patient: Dawn Gross      Date of Birth: [birth year] MRN: [id number] PCP: Ignatius Speckinghruv B Vyas, MD      Visit Date: 04/17/2016   Universal Protocol:    Date/Time: 12/07/179:27 AM  Consent Given By: the patient  Position: PRONE   Additional Comments: Vital signs were monitored before and after the procedure. Patient was prepped and draped in the usual sterile fashion. The correct patient, procedure, and  site was verified.   Injection Procedure Details:  Procedure Site One Meds Administered:  Meds ordered this encounter  Medications  . lidocaine (PF) (XYLOCAINE) 1 % injection 0.3 mL  . methylPREDNISolone acetate (DEPO-MEDROL) injection 80 mg      Laterality: Right  Location/Site:  L3-L4  Needle size: 22 G  Needle type: Spinal  Needle Placement: Transforaminal  Findings:  -Contrast Used: 1 mL iohexol 180 mg iodine/mL   -Comments: Excellent flow of contrast along the nerve and into the epidural space.  Procedure Details: After squaring off the end-plates of the desired vertebral level to get a true AP view, the C-arm was obliqued to the painful side so that the superior articulating process is positioned about 1/3 the length of the inferior endplate.  The needle was aimed toward the junction of the superior articular process and the transverse process of the inferior vertebrae. The needle's initial entry is in the lower third of the foramen through Kambin's triangle. The soft tissues overlying this target were infiltrated with 2-3 ml. of 1% Lidocaine without Epinephrine.  The spinal needle was then inserted and advanced toward the target using a "trajectory" view along the fluoroscope beam.  Under AP and lateral visualization, the needle was advanced so it did not puncture dura and did not traverse medially beyond the 6 o'clock position of the pedicle. Bi-planar projections were used to confirm position. Aspiration was confirmed to be negative for CSF and/or blood. A 1-2 ml. volume of Isovue-250 was injected and flow of contrast  was noted at each level. Radiographs were obtained for documentation purposes.   After attaining the desired flow of contrast documented above, a 0.5 to 1.0 ml test dose of 0.25% Marcaine was injected into each respective transforaminal space.  The patient was observed for 90 seconds post injection.  After no sensory deficits were reported, and normal lower  extremity motor function was noted,   the above injectate was administered so that equal amounts of the injectate were placed at each foramen (level) into the transforaminal epidural space.   Additional Comments:  The patient tolerated the procedure well Dressing: Band-Aid    Post-procedure details: Patient was observed during the procedure. Post-procedure instructions were reviewed.  Patient left the clinic in stable condition.   Clinical History: No specialty comments available.  She reports that she has quit smoking. Her smoking use included Cigarettes. She has a 0.36 pack-year smoking history. She has never used smokeless tobacco. No results for input(s): HGBA1C, LABURIC in the last 8760 hours.  Objective:  VS:  HT:    WT:   BMI:     BP:130/73  HR:62bpm  TEMP:98 F (36.7 C)(Oral)  RESP:100 % Physical Exam  Musculoskeletal:  The patient ambulates with a cane with a somewhat antalgic gait to the right. She has a forward flexed spine. She has good distal strength.    Ortho Exam Imaging: No results found.  Past Medical/Family/Surgical/Social History: Medications & Allergies reviewed per EMR Patient Active Problem List   Diagnosis Date Noted  . 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 Lumbar radiculopathy, chronic 01/16/2015   Past Medical History:  Diagnosis Date  . Arthritis    "all over"  . Cataract    immature, right  . Chronic lower back pain   . Depression    hx of-no meds  . Headache    "weekly" (07/05/2015)  . Hyperlipidemia    takes Fish Oil daily  . Hypertension    takes Lisinopril daily  . Joint pain   . Joint swelling   . Nocturia   . Peripheral edema    takes Lasix daily as needed  . Peripheral neuropathy (HCC)    no meds   No family history on file. Past Surgical History:  Procedure Laterality Date  . BACK SURGERY    . CHOLECYSTECTOMY OPEN    . COLONOSCOPY    . JOINT REPLACEMENT    . KNEE ARTHROSCOPY Bilateral   . POSTERIOR FUSION  CERVICAL SPINE  ~ 2014   "put screws and bolts in"  . TOTAL KNEE ARTHROPLASTY Bilateral   . TOTAL SHOULDER ARTHROPLASTY Left 07/05/2015  . TOTAL SHOULDER ARTHROPLASTY Left 07/05/2015   Procedure: LEFT TOTAL SHOULDER ARTHROPLASTY;  Surgeon: Francena HanlyKevin Supple, MD;  Location: [facility] OR;  Service: Orthopedics;  Laterality: Left;  Marland Kitchen. VAGINAL HYSTERECTOMY     Social History   Occupational History  . Not on file.   Social History Main Topics  . Smoking status: Former Smoker    Packs/day: 0.12    Years: 3.00    Types: Cigarettes  . Smokeless tobacco: Never Used     Comment: quit smoking in the 1980s  . Alcohol use 3.0 oz/week    5 Glasses of wine per week  . Drug use: No  . Sexual activity: Not on file

## 2016-05-02 DIAGNOSIS — G629 Polyneuropathy, unspecified: Secondary | ICD-10-CM | POA: Diagnosis not present

## 2016-05-02 DIAGNOSIS — Z6841 Body Mass Index (BMI) 40.0 and over, adult: Secondary | ICD-10-CM | POA: Diagnosis not present

## 2016-05-02 DIAGNOSIS — M5416 Radiculopathy, lumbar region: Secondary | ICD-10-CM | POA: Diagnosis not present

## 2016-05-02 DIAGNOSIS — Z299 Encounter for prophylactic measures, unspecified: Secondary | ICD-10-CM | POA: Diagnosis not present

## 2016-05-02 DIAGNOSIS — M1710 Unilateral primary osteoarthritis, unspecified knee: Secondary | ICD-10-CM | POA: Diagnosis not present

## 2016-05-30 ENCOUNTER — Other Ambulatory Visit (INDEPENDENT_AMBULATORY_CARE_PROVIDER_SITE_OTHER): Payer: Self-pay | Admitting: Physical Medicine and Rehabilitation

## 2016-06-09 ENCOUNTER — Other Ambulatory Visit (INDEPENDENT_AMBULATORY_CARE_PROVIDER_SITE_OTHER): Payer: Self-pay | Admitting: Physical Medicine and Rehabilitation

## 2016-06-09 DIAGNOSIS — M792 Neuralgia and neuritis, unspecified: Secondary | ICD-10-CM

## 2016-06-09 MED ORDER — TOPIRAMATE 50 MG PO TABS
ORAL_TABLET | ORAL | 1 refills | Status: AC
Start: 2016-06-09 — End: ?

## 2016-06-09 NOTE — Progress Notes (Signed)
Pharmacy notified us the patient wanted a refill of her medication. We have prescribed this last year for nerve pain which was recalcitrant to other medications. We have started ramping her up to a dose of 50 mg twice a day which she did attain. We did rewrite the prescription for that.

## 2016-09-02 DIAGNOSIS — M170 Bilateral primary osteoarthritis of knee: Secondary | ICD-10-CM | POA: Diagnosis not present

## 2016-09-02 DIAGNOSIS — M5416 Radiculopathy, lumbar region: Secondary | ICD-10-CM | POA: Diagnosis not present

## 2016-09-29 DIAGNOSIS — M25551 Pain in right hip: Secondary | ICD-10-CM | POA: Diagnosis not present

## 2016-09-29 DIAGNOSIS — M48062 Spinal stenosis, lumbar region with neurogenic claudication: Secondary | ICD-10-CM | POA: Diagnosis not present

## 2016-09-29 DIAGNOSIS — M545 Low back pain: Secondary | ICD-10-CM | POA: Diagnosis not present

## 2016-09-29 DIAGNOSIS — M5416 Radiculopathy, lumbar region: Secondary | ICD-10-CM | POA: Diagnosis not present

## 2016-09-30 DIAGNOSIS — M545 Low back pain: Secondary | ICD-10-CM | POA: Diagnosis not present

## 2016-10-21 DIAGNOSIS — M5416 Radiculopathy, lumbar region: Secondary | ICD-10-CM | POA: Diagnosis not present

## 2016-10-23 DIAGNOSIS — M5126 Other intervertebral disc displacement, lumbar region: Secondary | ICD-10-CM | POA: Diagnosis not present

## 2016-10-23 DIAGNOSIS — M48062 Spinal stenosis, lumbar region with neurogenic claudication: Secondary | ICD-10-CM | POA: Diagnosis not present

## 2016-10-23 DIAGNOSIS — M48061 Spinal stenosis, lumbar region without neurogenic claudication: Secondary | ICD-10-CM | POA: Diagnosis not present

## 2016-10-29 DIAGNOSIS — M5136 Other intervertebral disc degeneration, lumbar region: Secondary | ICD-10-CM | POA: Diagnosis not present

## 2016-10-29 DIAGNOSIS — I10 Essential (primary) hypertension: Secondary | ICD-10-CM | POA: Diagnosis not present

## 2016-10-29 DIAGNOSIS — Z6841 Body Mass Index (BMI) 40.0 and over, adult: Secondary | ICD-10-CM | POA: Diagnosis not present

## 2016-10-29 DIAGNOSIS — M48062 Spinal stenosis, lumbar region with neurogenic claudication: Secondary | ICD-10-CM | POA: Diagnosis not present

## 2016-11-11 DIAGNOSIS — E7800 Pure hypercholesterolemia, unspecified: Secondary | ICD-10-CM | POA: Diagnosis not present

## 2016-11-11 DIAGNOSIS — Z7189 Other specified counseling: Secondary | ICD-10-CM | POA: Diagnosis not present

## 2016-11-11 DIAGNOSIS — E669 Obesity, unspecified: Secondary | ICD-10-CM | POA: Diagnosis not present

## 2016-11-11 DIAGNOSIS — Z1211 Encounter for screening for malignant neoplasm of colon: Secondary | ICD-10-CM | POA: Diagnosis not present

## 2016-11-11 DIAGNOSIS — I10 Essential (primary) hypertension: Secondary | ICD-10-CM | POA: Diagnosis not present

## 2016-11-11 DIAGNOSIS — Z79899 Other long term (current) drug therapy: Secondary | ICD-10-CM | POA: Diagnosis not present

## 2016-11-11 DIAGNOSIS — Z299 Encounter for prophylactic measures, unspecified: Secondary | ICD-10-CM | POA: Diagnosis not present

## 2016-11-11 DIAGNOSIS — Z6841 Body Mass Index (BMI) 40.0 and over, adult: Secondary | ICD-10-CM | POA: Diagnosis not present

## 2016-11-11 DIAGNOSIS — Z1389 Encounter for screening for other disorder: Secondary | ICD-10-CM | POA: Diagnosis not present

## 2016-11-11 DIAGNOSIS — R5383 Other fatigue: Secondary | ICD-10-CM | POA: Diagnosis not present

## 2016-11-11 DIAGNOSIS — Z0000 Encounter for general adult medical examination without abnormal findings: Secondary | ICD-10-CM | POA: Diagnosis not present

## 2016-11-13 ENCOUNTER — Other Ambulatory Visit (HOSPITAL_COMMUNITY): Payer: Self-pay | Admitting: Internal Medicine

## 2016-11-13 DIAGNOSIS — Z1231 Encounter for screening mammogram for malignant neoplasm of breast: Secondary | ICD-10-CM

## 2016-11-17 ENCOUNTER — Ambulatory Visit (HOSPITAL_COMMUNITY)
Admission: RE | Admit: 2016-11-17 | Discharge: 2016-11-17 | Disposition: A | Discharge status: Home / Self Care | Payer: PPO | Source: Ambulatory Visit | Attending: Internal Medicine | Admitting: Internal Medicine

## 2016-11-17 DIAGNOSIS — Z1231 Encounter for screening mammogram for malignant neoplasm of breast: Secondary | ICD-10-CM | POA: Insufficient documentation

## 2016-11-18 DIAGNOSIS — Z713 Dietary counseling and surveillance: Secondary | ICD-10-CM | POA: Diagnosis not present

## 2016-11-18 DIAGNOSIS — E669 Obesity, unspecified: Secondary | ICD-10-CM | POA: Diagnosis not present

## 2016-11-18 DIAGNOSIS — Z299 Encounter for prophylactic measures, unspecified: Secondary | ICD-10-CM | POA: Diagnosis not present

## 2016-11-18 DIAGNOSIS — I10 Essential (primary) hypertension: Secondary | ICD-10-CM | POA: Diagnosis not present

## 2016-11-18 DIAGNOSIS — R109 Unspecified abdominal pain: Secondary | ICD-10-CM | POA: Diagnosis not present

## 2016-11-18 DIAGNOSIS — E7800 Pure hypercholesterolemia, unspecified: Secondary | ICD-10-CM | POA: Diagnosis not present

## 2016-11-18 DIAGNOSIS — Z6841 Body Mass Index (BMI) 40.0 and over, adult: Secondary | ICD-10-CM | POA: Diagnosis not present

## 2016-11-21 DIAGNOSIS — R109 Unspecified abdominal pain: Secondary | ICD-10-CM | POA: Diagnosis not present

## 2016-11-21 DIAGNOSIS — I715 Thoracoabdominal aortic aneurysm, ruptured: Secondary | ICD-10-CM | POA: Diagnosis not present

## 2016-11-21 DIAGNOSIS — I714 Abdominal aortic aneurysm, without rupture: Secondary | ICD-10-CM | POA: Diagnosis not present

## 2016-11-21 DIAGNOSIS — K439 Ventral hernia without obstruction or gangrene: Secondary | ICD-10-CM | POA: Diagnosis not present

## 2016-11-21 DIAGNOSIS — R590 Localized enlarged lymph nodes: Secondary | ICD-10-CM | POA: Diagnosis not present

## 2016-11-21 DIAGNOSIS — K449 Diaphragmatic hernia without obstruction or gangrene: Secondary | ICD-10-CM | POA: Diagnosis not present

## 2016-11-21 DIAGNOSIS — N200 Calculus of kidney: Secondary | ICD-10-CM | POA: Diagnosis not present

## 2016-12-03 DIAGNOSIS — Z713 Dietary counseling and surveillance: Secondary | ICD-10-CM | POA: Diagnosis not present

## 2016-12-03 DIAGNOSIS — E7800 Pure hypercholesterolemia, unspecified: Secondary | ICD-10-CM | POA: Diagnosis not present

## 2016-12-03 DIAGNOSIS — Z6841 Body Mass Index (BMI) 40.0 and over, adult: Secondary | ICD-10-CM | POA: Diagnosis not present

## 2016-12-03 DIAGNOSIS — R599 Enlarged lymph nodes, unspecified: Secondary | ICD-10-CM | POA: Diagnosis not present

## 2016-12-03 DIAGNOSIS — Z299 Encounter for prophylactic measures, unspecified: Secondary | ICD-10-CM | POA: Diagnosis not present

## 2016-12-03 DIAGNOSIS — I10 Essential (primary) hypertension: Secondary | ICD-10-CM | POA: Diagnosis not present

## 2016-12-03 DIAGNOSIS — Z90710 Acquired absence of both cervix and uterus: Secondary | ICD-10-CM | POA: Diagnosis not present

## 2016-12-03 DIAGNOSIS — E559 Vitamin D deficiency, unspecified: Secondary | ICD-10-CM | POA: Diagnosis not present

## 2016-12-16 DIAGNOSIS — Z299 Encounter for prophylactic measures, unspecified: Secondary | ICD-10-CM | POA: Diagnosis not present

## 2016-12-16 DIAGNOSIS — Z87891 Personal history of nicotine dependence: Secondary | ICD-10-CM | POA: Diagnosis not present

## 2016-12-16 DIAGNOSIS — E7800 Pure hypercholesterolemia, unspecified: Secondary | ICD-10-CM | POA: Diagnosis not present

## 2016-12-16 DIAGNOSIS — Z6841 Body Mass Index (BMI) 40.0 and over, adult: Secondary | ICD-10-CM | POA: Diagnosis not present

## 2016-12-16 DIAGNOSIS — I10 Essential (primary) hypertension: Secondary | ICD-10-CM | POA: Diagnosis not present

## 2016-12-16 DIAGNOSIS — R599 Enlarged lymph nodes, unspecified: Secondary | ICD-10-CM | POA: Diagnosis not present

## 2017-01-15 DIAGNOSIS — R1031 Right lower quadrant pain: Secondary | ICD-10-CM | POA: Diagnosis not present

## 2017-01-30 DIAGNOSIS — I10 Essential (primary) hypertension: Secondary | ICD-10-CM | POA: Diagnosis not present

## 2017-01-30 DIAGNOSIS — E7800 Pure hypercholesterolemia, unspecified: Secondary | ICD-10-CM | POA: Diagnosis not present

## 2017-02-17 DIAGNOSIS — R102 Pelvic and perineal pain: Secondary | ICD-10-CM | POA: Diagnosis not present

## 2017-02-27 DIAGNOSIS — Z299 Encounter for prophylactic measures, unspecified: Secondary | ICD-10-CM | POA: Diagnosis not present

## 2017-02-27 DIAGNOSIS — G629 Polyneuropathy, unspecified: Secondary | ICD-10-CM | POA: Diagnosis not present

## 2017-02-27 DIAGNOSIS — Z6841 Body Mass Index (BMI) 40.0 and over, adult: Secondary | ICD-10-CM | POA: Diagnosis not present

## 2017-02-27 DIAGNOSIS — E7800 Pure hypercholesterolemia, unspecified: Secondary | ICD-10-CM | POA: Diagnosis not present

## 2017-02-27 DIAGNOSIS — I10 Essential (primary) hypertension: Secondary | ICD-10-CM | POA: Diagnosis not present

## 2017-03-10 DIAGNOSIS — R109 Unspecified abdominal pain: Secondary | ICD-10-CM | POA: Diagnosis not present

## 2017-03-10 DIAGNOSIS — R102 Pelvic and perineal pain: Secondary | ICD-10-CM | POA: Diagnosis not present

## 2017-03-10 DIAGNOSIS — Z90710 Acquired absence of both cervix and uterus: Secondary | ICD-10-CM | POA: Diagnosis not present

## 2017-05-29 DIAGNOSIS — I10 Essential (primary) hypertension: Secondary | ICD-10-CM | POA: Diagnosis not present

## 2017-05-29 DIAGNOSIS — R591 Generalized enlarged lymph nodes: Secondary | ICD-10-CM | POA: Diagnosis not present

## 2017-05-29 DIAGNOSIS — Z6841 Body Mass Index (BMI) 40.0 and over, adult: Secondary | ICD-10-CM | POA: Diagnosis not present

## 2017-05-29 DIAGNOSIS — Z299 Encounter for prophylactic measures, unspecified: Secondary | ICD-10-CM | POA: Diagnosis not present

## 2017-05-29 DIAGNOSIS — Z87891 Personal history of nicotine dependence: Secondary | ICD-10-CM | POA: Diagnosis not present

## 2017-06-02 DIAGNOSIS — I10 Essential (primary) hypertension: Secondary | ICD-10-CM | POA: Diagnosis not present

## 2017-06-03 DIAGNOSIS — I10 Essential (primary) hypertension: Secondary | ICD-10-CM | POA: Diagnosis not present

## 2017-06-03 DIAGNOSIS — M461 Sacroiliitis, not elsewhere classified: Secondary | ICD-10-CM | POA: Diagnosis not present

## 2017-06-03 DIAGNOSIS — R591 Generalized enlarged lymph nodes: Secondary | ICD-10-CM | POA: Diagnosis not present

## 2017-08-31 DIAGNOSIS — H6120 Impacted cerumen, unspecified ear: Secondary | ICD-10-CM | POA: Diagnosis not present

## 2017-08-31 DIAGNOSIS — I10 Essential (primary) hypertension: Secondary | ICD-10-CM | POA: Diagnosis not present

## 2017-08-31 DIAGNOSIS — Z299 Encounter for prophylactic measures, unspecified: Secondary | ICD-10-CM | POA: Diagnosis not present

## 2017-08-31 DIAGNOSIS — Z6841 Body Mass Index (BMI) 40.0 and over, adult: Secondary | ICD-10-CM | POA: Diagnosis not present

## 2017-10-01 DIAGNOSIS — I10 Essential (primary) hypertension: Secondary | ICD-10-CM | POA: Diagnosis not present

## 2017-10-01 DIAGNOSIS — Z299 Encounter for prophylactic measures, unspecified: Secondary | ICD-10-CM | POA: Diagnosis not present

## 2017-10-01 DIAGNOSIS — H6120 Impacted cerumen, unspecified ear: Secondary | ICD-10-CM | POA: Diagnosis not present

## 2017-10-01 DIAGNOSIS — Z6841 Body Mass Index (BMI) 40.0 and over, adult: Secondary | ICD-10-CM | POA: Diagnosis not present

## 2017-10-01 DIAGNOSIS — R509 Fever, unspecified: Secondary | ICD-10-CM | POA: Diagnosis not present

## 2017-10-09 DIAGNOSIS — E7800 Pure hypercholesterolemia, unspecified: Secondary | ICD-10-CM | POA: Diagnosis not present

## 2017-10-09 DIAGNOSIS — I10 Essential (primary) hypertension: Secondary | ICD-10-CM | POA: Diagnosis not present

## 2017-11-06 DIAGNOSIS — I10 Essential (primary) hypertension: Secondary | ICD-10-CM | POA: Diagnosis not present

## 2017-11-06 DIAGNOSIS — E7800 Pure hypercholesterolemia, unspecified: Secondary | ICD-10-CM | POA: Diagnosis not present

## 2017-11-16 ENCOUNTER — Ambulatory Visit (INDEPENDENT_AMBULATORY_CARE_PROVIDER_SITE_OTHER): Payer: PPO | Admitting: Otolaryngology

## 2017-11-16 DIAGNOSIS — H6121 Impacted cerumen, right ear: Secondary | ICD-10-CM

## 2017-11-16 DIAGNOSIS — H900 Conductive hearing loss, bilateral: Secondary | ICD-10-CM | POA: Diagnosis not present

## 2017-11-17 DIAGNOSIS — Z1339 Encounter for screening examination for other mental health and behavioral disorders: Secondary | ICD-10-CM | POA: Diagnosis not present

## 2017-11-17 DIAGNOSIS — Z6841 Body Mass Index (BMI) 40.0 and over, adult: Secondary | ICD-10-CM | POA: Diagnosis not present

## 2017-11-17 DIAGNOSIS — Z1331 Encounter for screening for depression: Secondary | ICD-10-CM | POA: Diagnosis not present

## 2017-11-17 DIAGNOSIS — Z7189 Other specified counseling: Secondary | ICD-10-CM | POA: Diagnosis not present

## 2017-11-17 DIAGNOSIS — E559 Vitamin D deficiency, unspecified: Secondary | ICD-10-CM | POA: Diagnosis not present

## 2017-11-17 DIAGNOSIS — R5383 Other fatigue: Secondary | ICD-10-CM | POA: Diagnosis not present

## 2017-11-17 DIAGNOSIS — Z0000 Encounter for general adult medical examination without abnormal findings: Secondary | ICD-10-CM | POA: Diagnosis not present

## 2017-11-17 DIAGNOSIS — Z79899 Other long term (current) drug therapy: Secondary | ICD-10-CM | POA: Diagnosis not present

## 2017-11-17 DIAGNOSIS — Z1211 Encounter for screening for malignant neoplasm of colon: Secondary | ICD-10-CM | POA: Diagnosis not present

## 2017-11-17 DIAGNOSIS — Z299 Encounter for prophylactic measures, unspecified: Secondary | ICD-10-CM | POA: Diagnosis not present

## 2017-11-17 DIAGNOSIS — E7800 Pure hypercholesterolemia, unspecified: Secondary | ICD-10-CM | POA: Diagnosis not present

## 2017-11-17 DIAGNOSIS — I10 Essential (primary) hypertension: Secondary | ICD-10-CM | POA: Diagnosis not present

## 2017-12-25 ENCOUNTER — Other Ambulatory Visit (HOSPITAL_COMMUNITY): Payer: Self-pay | Admitting: Internal Medicine

## 2017-12-25 DIAGNOSIS — Z1231 Encounter for screening mammogram for malignant neoplasm of breast: Secondary | ICD-10-CM

## 2017-12-31 ENCOUNTER — Ambulatory Visit (HOSPITAL_COMMUNITY)
Admission: RE | Admit: 2017-12-31 | Discharge: 2017-12-31 | Disposition: A | Discharge status: Home / Self Care | Payer: PPO | Source: Ambulatory Visit | Attending: Internal Medicine | Admitting: Internal Medicine

## 2017-12-31 DIAGNOSIS — Z1231 Encounter for screening mammogram for malignant neoplasm of breast: Secondary | ICD-10-CM | POA: Insufficient documentation

## 2018-01-14 DIAGNOSIS — E7800 Pure hypercholesterolemia, unspecified: Secondary | ICD-10-CM | POA: Diagnosis not present

## 2018-01-14 DIAGNOSIS — I10 Essential (primary) hypertension: Secondary | ICD-10-CM | POA: Diagnosis not present

## 2018-01-19 DIAGNOSIS — L509 Urticaria, unspecified: Secondary | ICD-10-CM | POA: Diagnosis not present

## 2018-01-19 DIAGNOSIS — M1710 Unilateral primary osteoarthritis, unspecified knee: Secondary | ICD-10-CM | POA: Diagnosis not present

## 2018-01-19 DIAGNOSIS — Z6841 Body Mass Index (BMI) 40.0 and over, adult: Secondary | ICD-10-CM | POA: Diagnosis not present

## 2018-01-19 DIAGNOSIS — Z299 Encounter for prophylactic measures, unspecified: Secondary | ICD-10-CM | POA: Diagnosis not present

## 2018-01-19 DIAGNOSIS — I10 Essential (primary) hypertension: Secondary | ICD-10-CM | POA: Diagnosis not present

## 2018-01-19 DIAGNOSIS — L03115 Cellulitis of right lower limb: Secondary | ICD-10-CM | POA: Diagnosis not present

## 2018-02-17 DIAGNOSIS — Z299 Encounter for prophylactic measures, unspecified: Secondary | ICD-10-CM | POA: Diagnosis not present

## 2018-02-17 DIAGNOSIS — I10 Essential (primary) hypertension: Secondary | ICD-10-CM | POA: Diagnosis not present

## 2018-02-17 DIAGNOSIS — Z23 Encounter for immunization: Secondary | ICD-10-CM | POA: Diagnosis not present

## 2018-02-17 DIAGNOSIS — E7800 Pure hypercholesterolemia, unspecified: Secondary | ICD-10-CM | POA: Diagnosis not present

## 2018-02-17 DIAGNOSIS — Z6841 Body Mass Index (BMI) 40.0 and over, adult: Secondary | ICD-10-CM | POA: Diagnosis not present

## 2018-03-10 DIAGNOSIS — Z299 Encounter for prophylactic measures, unspecified: Secondary | ICD-10-CM | POA: Diagnosis not present

## 2018-03-10 DIAGNOSIS — I10 Essential (primary) hypertension: Secondary | ICD-10-CM | POA: Diagnosis not present

## 2018-03-10 DIAGNOSIS — Z6841 Body Mass Index (BMI) 40.0 and over, adult: Secondary | ICD-10-CM | POA: Diagnosis not present

## 2018-05-26 DIAGNOSIS — I10 Essential (primary) hypertension: Secondary | ICD-10-CM | POA: Diagnosis not present

## 2018-05-26 DIAGNOSIS — E7800 Pure hypercholesterolemia, unspecified: Secondary | ICD-10-CM | POA: Diagnosis not present

## 2018-06-14 DIAGNOSIS — I10 Essential (primary) hypertension: Secondary | ICD-10-CM | POA: Diagnosis not present

## 2018-06-14 DIAGNOSIS — Z789 Other specified health status: Secondary | ICD-10-CM | POA: Diagnosis not present

## 2018-06-14 DIAGNOSIS — Z299 Encounter for prophylactic measures, unspecified: Secondary | ICD-10-CM | POA: Diagnosis not present

## 2018-06-14 DIAGNOSIS — Z6841 Body Mass Index (BMI) 40.0 and over, adult: Secondary | ICD-10-CM | POA: Diagnosis not present

## 2018-06-14 DIAGNOSIS — E7800 Pure hypercholesterolemia, unspecified: Secondary | ICD-10-CM | POA: Diagnosis not present

## 2018-09-13 DIAGNOSIS — I10 Essential (primary) hypertension: Secondary | ICD-10-CM | POA: Diagnosis not present

## 2018-09-13 DIAGNOSIS — Z299 Encounter for prophylactic measures, unspecified: Secondary | ICD-10-CM | POA: Diagnosis not present

## 2018-09-13 DIAGNOSIS — Z6841 Body Mass Index (BMI) 40.0 and over, adult: Secondary | ICD-10-CM | POA: Diagnosis not present

## 2018-09-13 DIAGNOSIS — Z713 Dietary counseling and surveillance: Secondary | ICD-10-CM | POA: Diagnosis not present

## 2018-09-27 DIAGNOSIS — M25579 Pain in unspecified ankle and joints of unspecified foot: Secondary | ICD-10-CM | POA: Diagnosis not present

## 2018-09-27 DIAGNOSIS — M79671 Pain in right foot: Secondary | ICD-10-CM | POA: Diagnosis not present

## 2018-09-27 DIAGNOSIS — M79672 Pain in left foot: Secondary | ICD-10-CM | POA: Diagnosis not present

## 2018-10-18 DIAGNOSIS — E1140 Type 2 diabetes mellitus with diabetic neuropathy, unspecified: Secondary | ICD-10-CM | POA: Diagnosis not present

## 2018-10-18 DIAGNOSIS — M79672 Pain in left foot: Secondary | ICD-10-CM | POA: Diagnosis not present

## 2018-10-18 DIAGNOSIS — M79671 Pain in right foot: Secondary | ICD-10-CM | POA: Diagnosis not present

## 2018-10-18 DIAGNOSIS — M25579 Pain in unspecified ankle and joints of unspecified foot: Secondary | ICD-10-CM | POA: Diagnosis not present

## 2018-11-17 DIAGNOSIS — M79671 Pain in right foot: Secondary | ICD-10-CM | POA: Diagnosis not present

## 2018-11-17 DIAGNOSIS — M25579 Pain in unspecified ankle and joints of unspecified foot: Secondary | ICD-10-CM | POA: Diagnosis not present

## 2018-11-17 DIAGNOSIS — M79672 Pain in left foot: Secondary | ICD-10-CM | POA: Diagnosis not present

## 2018-11-23 DIAGNOSIS — Z1339 Encounter for screening examination for other mental health and behavioral disorders: Secondary | ICD-10-CM | POA: Diagnosis not present

## 2018-11-23 DIAGNOSIS — Z7189 Other specified counseling: Secondary | ICD-10-CM | POA: Diagnosis not present

## 2018-11-23 DIAGNOSIS — E7800 Pure hypercholesterolemia, unspecified: Secondary | ICD-10-CM | POA: Diagnosis not present

## 2018-11-23 DIAGNOSIS — Z6841 Body Mass Index (BMI) 40.0 and over, adult: Secondary | ICD-10-CM | POA: Diagnosis not present

## 2018-11-23 DIAGNOSIS — Z1331 Encounter for screening for depression: Secondary | ICD-10-CM | POA: Diagnosis not present

## 2018-11-23 DIAGNOSIS — Z79899 Other long term (current) drug therapy: Secondary | ICD-10-CM | POA: Diagnosis not present

## 2018-11-23 DIAGNOSIS — Z0000 Encounter for general adult medical examination without abnormal findings: Secondary | ICD-10-CM | POA: Diagnosis not present

## 2018-11-23 DIAGNOSIS — Z299 Encounter for prophylactic measures, unspecified: Secondary | ICD-10-CM | POA: Diagnosis not present

## 2018-11-23 DIAGNOSIS — R5383 Other fatigue: Secondary | ICD-10-CM | POA: Diagnosis not present

## 2018-11-23 DIAGNOSIS — I10 Essential (primary) hypertension: Secondary | ICD-10-CM | POA: Diagnosis not present

## 2018-11-23 DIAGNOSIS — Z1211 Encounter for screening for malignant neoplasm of colon: Secondary | ICD-10-CM | POA: Diagnosis not present

## 2018-11-23 DIAGNOSIS — E559 Vitamin D deficiency, unspecified: Secondary | ICD-10-CM | POA: Diagnosis not present

## 2018-11-25 DIAGNOSIS — I10 Essential (primary) hypertension: Secondary | ICD-10-CM | POA: Diagnosis not present

## 2018-11-25 DIAGNOSIS — E7800 Pure hypercholesterolemia, unspecified: Secondary | ICD-10-CM | POA: Diagnosis not present

## 2018-11-30 DIAGNOSIS — E2839 Other primary ovarian failure: Secondary | ICD-10-CM | POA: Diagnosis not present

## 2018-12-06 ENCOUNTER — Other Ambulatory Visit (HOSPITAL_COMMUNITY): Payer: Self-pay | Admitting: Internal Medicine

## 2018-12-06 DIAGNOSIS — Z1231 Encounter for screening mammogram for malignant neoplasm of breast: Secondary | ICD-10-CM

## 2018-12-14 DIAGNOSIS — M79672 Pain in left foot: Secondary | ICD-10-CM | POA: Diagnosis not present

## 2018-12-14 DIAGNOSIS — M79671 Pain in right foot: Secondary | ICD-10-CM | POA: Diagnosis not present

## 2018-12-14 DIAGNOSIS — M25579 Pain in unspecified ankle and joints of unspecified foot: Secondary | ICD-10-CM | POA: Diagnosis not present

## 2019-01-03 ENCOUNTER — Other Ambulatory Visit: Payer: Self-pay

## 2019-01-03 ENCOUNTER — Ambulatory Visit (HOSPITAL_COMMUNITY)
Admission: RE | Admit: 2019-01-03 | Discharge: 2019-01-03 | Disposition: A | Discharge status: Home / Self Care | Payer: PPO | Source: Ambulatory Visit | Attending: Internal Medicine | Admitting: Internal Medicine

## 2019-01-03 DIAGNOSIS — Z1231 Encounter for screening mammogram for malignant neoplasm of breast: Secondary | ICD-10-CM | POA: Diagnosis not present

## 2019-01-11 DIAGNOSIS — M25579 Pain in unspecified ankle and joints of unspecified foot: Secondary | ICD-10-CM | POA: Diagnosis not present

## 2019-01-11 DIAGNOSIS — M79672 Pain in left foot: Secondary | ICD-10-CM | POA: Diagnosis not present

## 2019-01-11 DIAGNOSIS — M79671 Pain in right foot: Secondary | ICD-10-CM | POA: Diagnosis not present

## 2019-01-28 DIAGNOSIS — G629 Polyneuropathy, unspecified: Secondary | ICD-10-CM | POA: Diagnosis not present

## 2019-01-28 DIAGNOSIS — M2012 Hallux valgus (acquired), left foot: Secondary | ICD-10-CM | POA: Diagnosis not present

## 2019-01-28 DIAGNOSIS — M79675 Pain in left toe(s): Secondary | ICD-10-CM | POA: Diagnosis not present

## 2019-01-28 DIAGNOSIS — S92412A Displaced fracture of proximal phalanx of left great toe, initial encounter for closed fracture: Secondary | ICD-10-CM | POA: Diagnosis not present

## 2019-01-28 DIAGNOSIS — S92402A Displaced unspecified fracture of left great toe, initial encounter for closed fracture: Secondary | ICD-10-CM | POA: Diagnosis not present

## 2019-02-01 DIAGNOSIS — M2012 Hallux valgus (acquired), left foot: Secondary | ICD-10-CM | POA: Diagnosis not present

## 2019-02-01 DIAGNOSIS — G629 Polyneuropathy, unspecified: Secondary | ICD-10-CM | POA: Diagnosis not present

## 2019-02-01 DIAGNOSIS — I10 Essential (primary) hypertension: Secondary | ICD-10-CM | POA: Diagnosis not present

## 2019-02-01 DIAGNOSIS — Z6841 Body Mass Index (BMI) 40.0 and over, adult: Secondary | ICD-10-CM | POA: Diagnosis not present

## 2019-02-01 DIAGNOSIS — S92402A Displaced unspecified fracture of left great toe, initial encounter for closed fracture: Secondary | ICD-10-CM | POA: Diagnosis not present

## 2019-02-01 DIAGNOSIS — W19XXXA Unspecified fall, initial encounter: Secondary | ICD-10-CM | POA: Diagnosis not present

## 2019-02-01 DIAGNOSIS — Z299 Encounter for prophylactic measures, unspecified: Secondary | ICD-10-CM | POA: Diagnosis not present

## 2019-02-07 DIAGNOSIS — I70211 Atherosclerosis of native arteries of extremities with intermittent claudication, right leg: Secondary | ICD-10-CM | POA: Diagnosis not present

## 2019-02-12 DIAGNOSIS — M2012 Hallux valgus (acquired), left foot: Secondary | ICD-10-CM | POA: Diagnosis not present

## 2019-02-12 DIAGNOSIS — G629 Polyneuropathy, unspecified: Secondary | ICD-10-CM | POA: Diagnosis not present

## 2019-02-23 DIAGNOSIS — G629 Polyneuropathy, unspecified: Secondary | ICD-10-CM | POA: Diagnosis not present

## 2019-02-23 DIAGNOSIS — Z713 Dietary counseling and surveillance: Secondary | ICD-10-CM | POA: Diagnosis not present

## 2019-02-23 DIAGNOSIS — Z299 Encounter for prophylactic measures, unspecified: Secondary | ICD-10-CM | POA: Diagnosis not present

## 2019-02-23 DIAGNOSIS — Z6841 Body Mass Index (BMI) 40.0 and over, adult: Secondary | ICD-10-CM | POA: Diagnosis not present

## 2019-02-23 DIAGNOSIS — I10 Essential (primary) hypertension: Secondary | ICD-10-CM | POA: Diagnosis not present

## 2019-02-25 DIAGNOSIS — E7800 Pure hypercholesterolemia, unspecified: Secondary | ICD-10-CM | POA: Diagnosis not present

## 2019-02-25 DIAGNOSIS — I10 Essential (primary) hypertension: Secondary | ICD-10-CM | POA: Diagnosis not present

## 2019-03-16 DIAGNOSIS — Z299 Encounter for prophylactic measures, unspecified: Secondary | ICD-10-CM | POA: Diagnosis not present

## 2019-03-16 DIAGNOSIS — I10 Essential (primary) hypertension: Secondary | ICD-10-CM | POA: Diagnosis not present

## 2019-03-16 DIAGNOSIS — Z6841 Body Mass Index (BMI) 40.0 and over, adult: Secondary | ICD-10-CM | POA: Diagnosis not present

## 2019-03-16 DIAGNOSIS — G629 Polyneuropathy, unspecified: Secondary | ICD-10-CM | POA: Diagnosis not present

## 2019-03-16 DIAGNOSIS — Z713 Dietary counseling and surveillance: Secondary | ICD-10-CM | POA: Diagnosis not present

## 2019-04-12 DIAGNOSIS — I10 Essential (primary) hypertension: Secondary | ICD-10-CM | POA: Diagnosis not present

## 2019-04-12 DIAGNOSIS — E7800 Pure hypercholesterolemia, unspecified: Secondary | ICD-10-CM | POA: Diagnosis not present

## 2019-06-16 DIAGNOSIS — I10 Essential (primary) hypertension: Secondary | ICD-10-CM | POA: Diagnosis not present

## 2019-06-16 DIAGNOSIS — Z6841 Body Mass Index (BMI) 40.0 and over, adult: Secondary | ICD-10-CM | POA: Diagnosis not present

## 2019-06-16 DIAGNOSIS — Z299 Encounter for prophylactic measures, unspecified: Secondary | ICD-10-CM | POA: Diagnosis not present

## 2019-06-16 DIAGNOSIS — E7800 Pure hypercholesterolemia, unspecified: Secondary | ICD-10-CM | POA: Diagnosis not present

## 2019-06-16 DIAGNOSIS — G629 Polyneuropathy, unspecified: Secondary | ICD-10-CM | POA: Diagnosis not present

## 2019-06-16 DIAGNOSIS — Z789 Other specified health status: Secondary | ICD-10-CM | POA: Diagnosis not present

## 2019-09-14 DIAGNOSIS — S8265XA Nondisplaced fracture of lateral malleolus of left fibula, initial encounter for closed fracture: Secondary | ICD-10-CM | POA: Diagnosis not present

## 2019-09-14 DIAGNOSIS — I10 Essential (primary) hypertension: Secondary | ICD-10-CM | POA: Diagnosis not present

## 2019-09-14 DIAGNOSIS — Z713 Dietary counseling and surveillance: Secondary | ICD-10-CM | POA: Diagnosis not present

## 2019-09-14 DIAGNOSIS — Z299 Encounter for prophylactic measures, unspecified: Secondary | ICD-10-CM | POA: Diagnosis not present

## 2019-09-14 DIAGNOSIS — Z6841 Body Mass Index (BMI) 40.0 and over, adult: Secondary | ICD-10-CM | POA: Diagnosis not present

## 2019-09-14 DIAGNOSIS — M79672 Pain in left foot: Secondary | ICD-10-CM | POA: Diagnosis not present

## 2019-10-05 DIAGNOSIS — S8265XD Nondisplaced fracture of lateral malleolus of left fibula, subsequent encounter for closed fracture with routine healing: Secondary | ICD-10-CM | POA: Diagnosis not present

## 2019-10-05 DIAGNOSIS — M79672 Pain in left foot: Secondary | ICD-10-CM | POA: Diagnosis not present

## 2019-12-07 DIAGNOSIS — Z6841 Body Mass Index (BMI) 40.0 and over, adult: Secondary | ICD-10-CM | POA: Diagnosis not present

## 2019-12-07 DIAGNOSIS — Z79899 Other long term (current) drug therapy: Secondary | ICD-10-CM | POA: Diagnosis not present

## 2019-12-07 DIAGNOSIS — E7800 Pure hypercholesterolemia, unspecified: Secondary | ICD-10-CM | POA: Diagnosis not present

## 2019-12-07 DIAGNOSIS — Z1339 Encounter for screening examination for other mental health and behavioral disorders: Secondary | ICD-10-CM | POA: Diagnosis not present

## 2019-12-07 DIAGNOSIS — Z0000 Encounter for general adult medical examination without abnormal findings: Secondary | ICD-10-CM | POA: Diagnosis not present

## 2019-12-07 DIAGNOSIS — R5383 Other fatigue: Secondary | ICD-10-CM | POA: Diagnosis not present

## 2019-12-07 DIAGNOSIS — Z1331 Encounter for screening for depression: Secondary | ICD-10-CM | POA: Diagnosis not present

## 2019-12-07 DIAGNOSIS — Z299 Encounter for prophylactic measures, unspecified: Secondary | ICD-10-CM | POA: Diagnosis not present

## 2019-12-07 DIAGNOSIS — Z7189 Other specified counseling: Secondary | ICD-10-CM | POA: Diagnosis not present

## 2019-12-07 DIAGNOSIS — I10 Essential (primary) hypertension: Secondary | ICD-10-CM | POA: Diagnosis not present

## 2019-12-09 DIAGNOSIS — I10 Essential (primary) hypertension: Secondary | ICD-10-CM | POA: Diagnosis not present

## 2019-12-09 DIAGNOSIS — E7800 Pure hypercholesterolemia, unspecified: Secondary | ICD-10-CM | POA: Diagnosis not present

## 2020-01-11 DIAGNOSIS — R922 Inconclusive mammogram: Secondary | ICD-10-CM | POA: Diagnosis not present

## 2020-01-11 DIAGNOSIS — N644 Mastodynia: Secondary | ICD-10-CM | POA: Diagnosis not present

## 2020-02-07 IMAGING — MG DIGITAL SCREENING BILATERAL MAMMOGRAM WITH TOMO AND CAD
6 of 10 series · 6 of 30 positions shown · non-contrast
Comparison: Previous exam(s).

CLINICAL DATA: Screening.

EXAM:
DIGITAL SCREENING BILATERAL MAMMOGRAM WITH TOMO AND CAD

[L CC synth-2D]
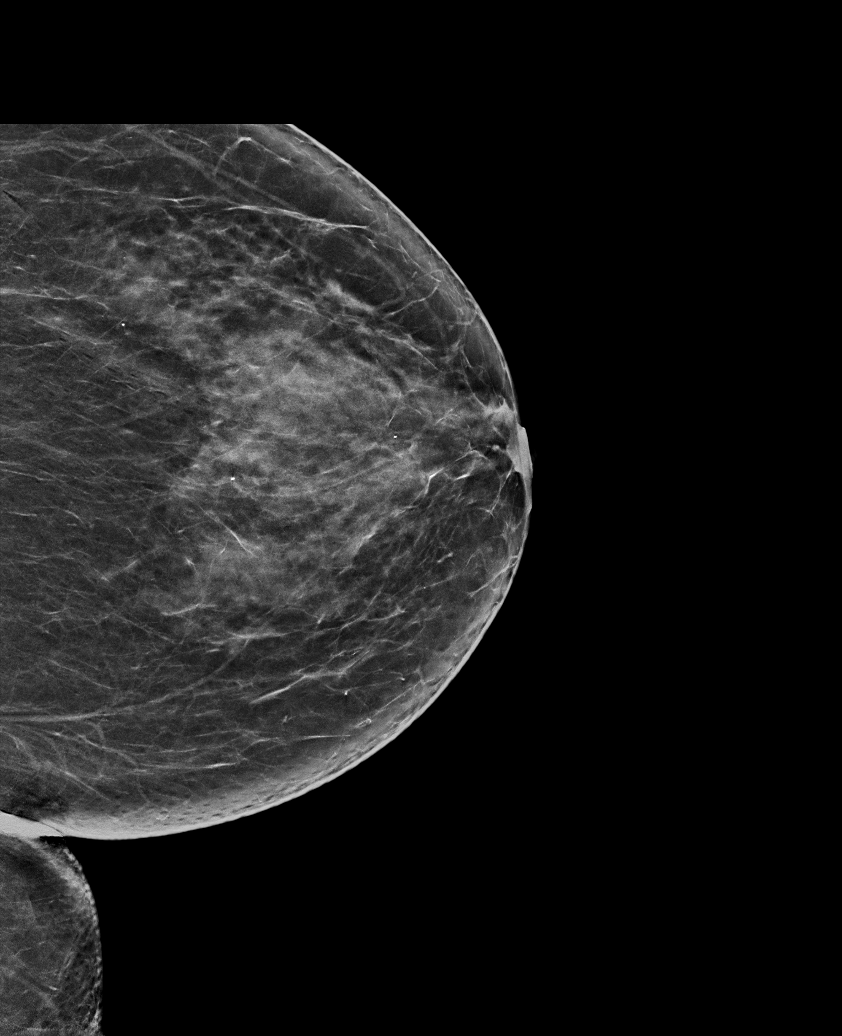

[R CC synth-2D (1 of 2)]
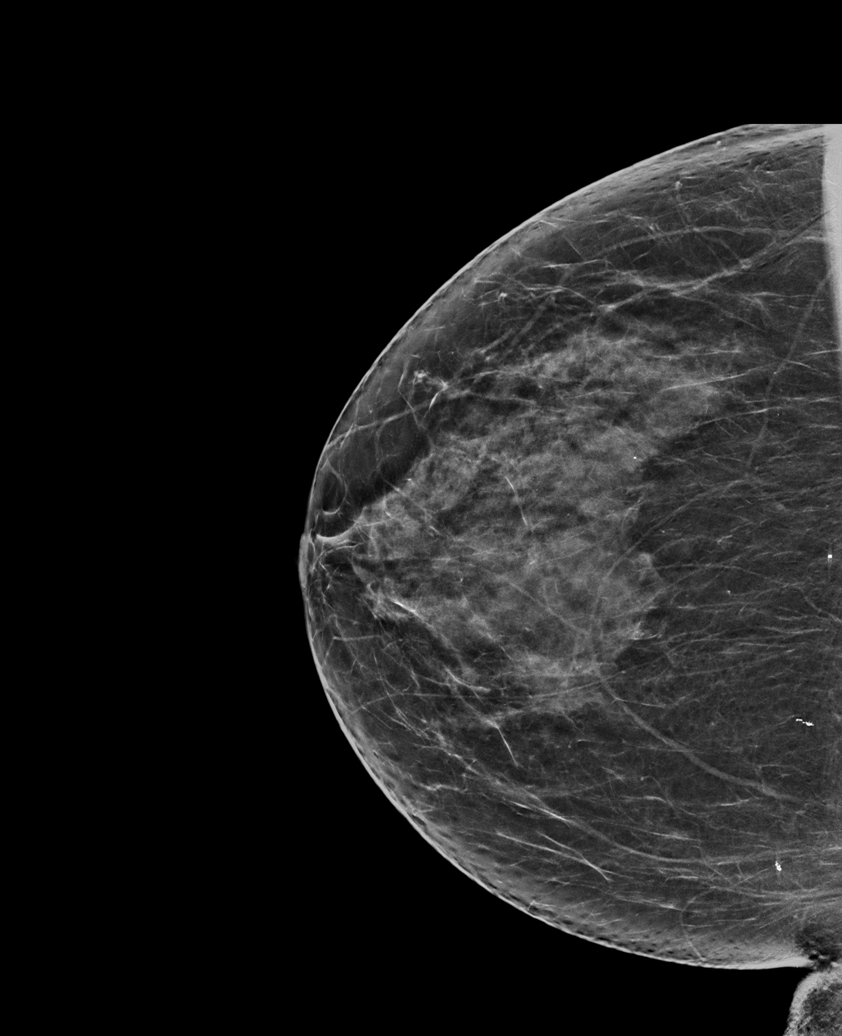

[L MLO synth-2D]
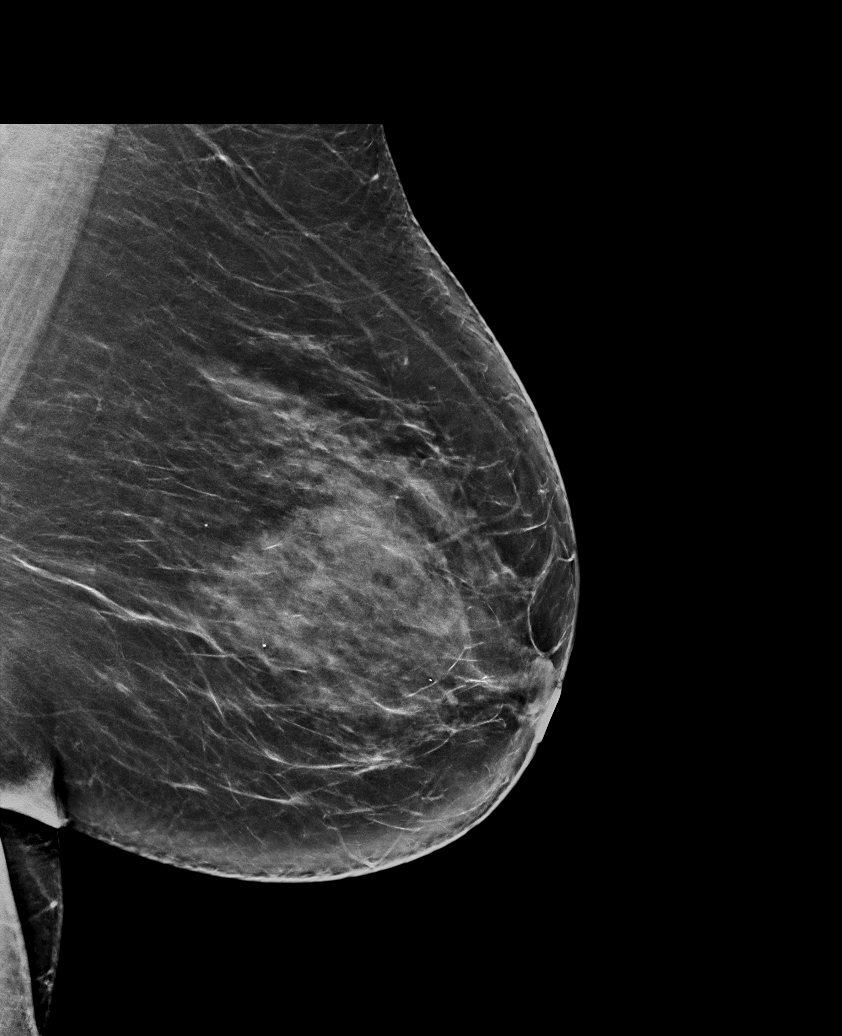

[R CC synth-2D (2 of 2)]
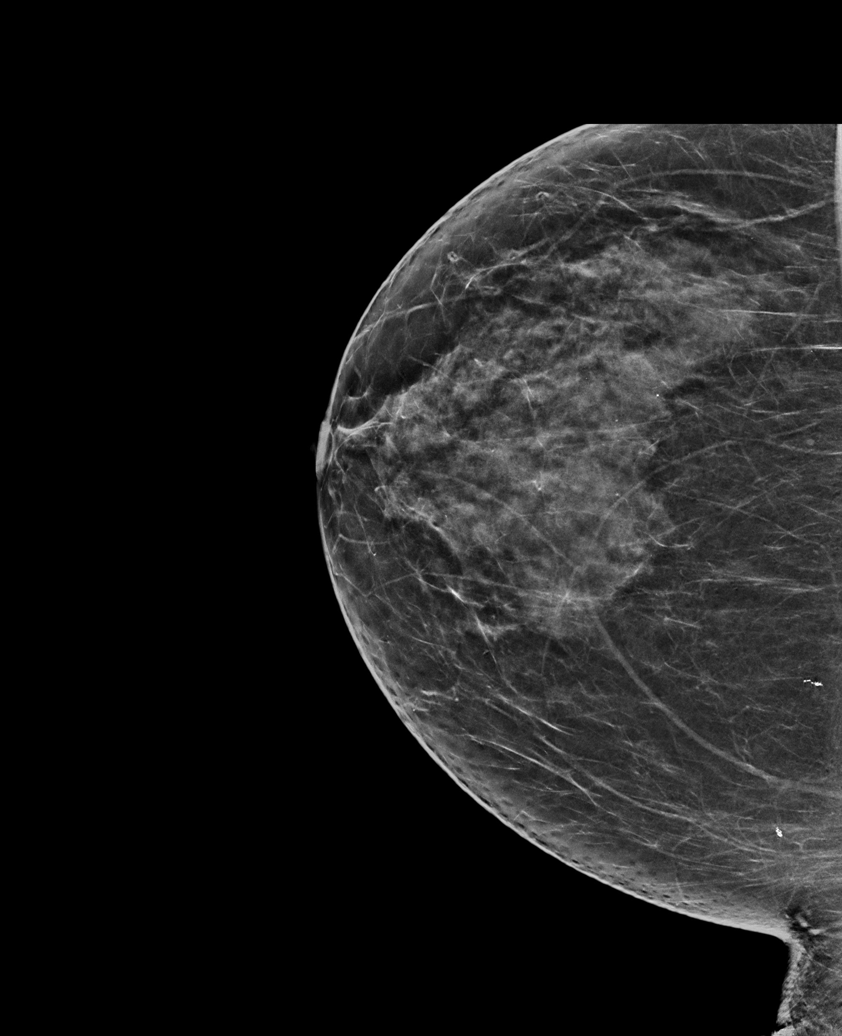

[R MLO synth-2D]
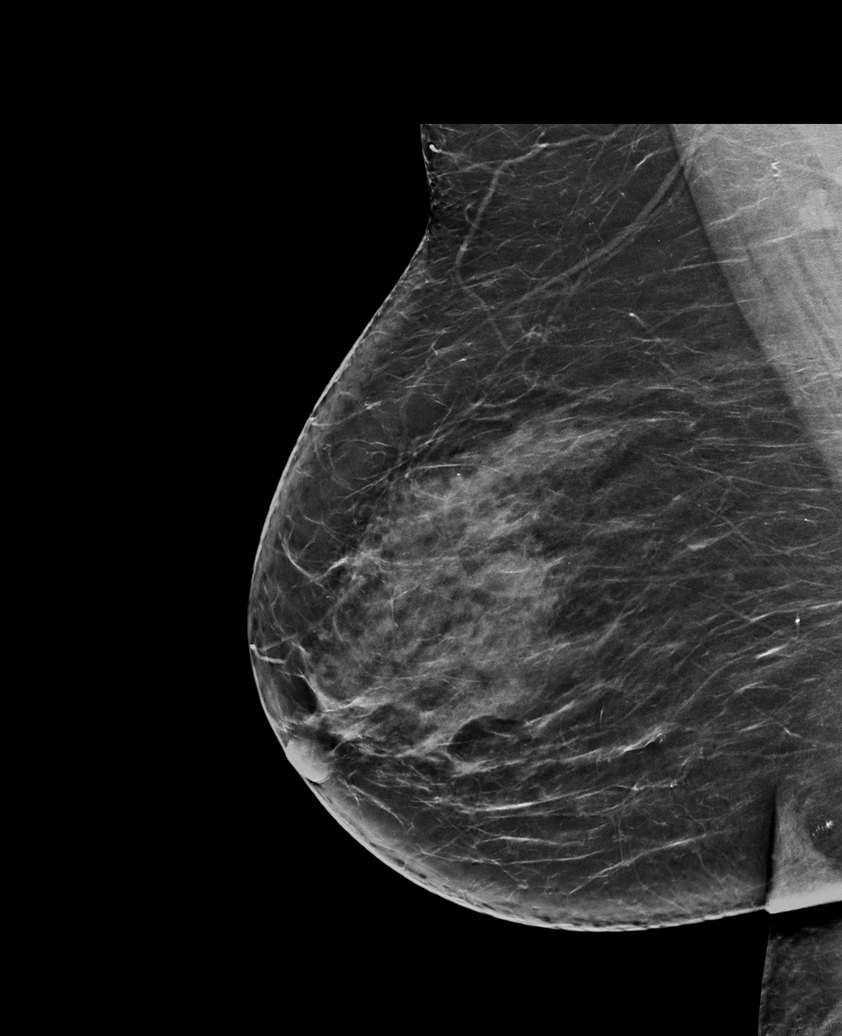

[R CC tomo · tomo slice 36/71.0]
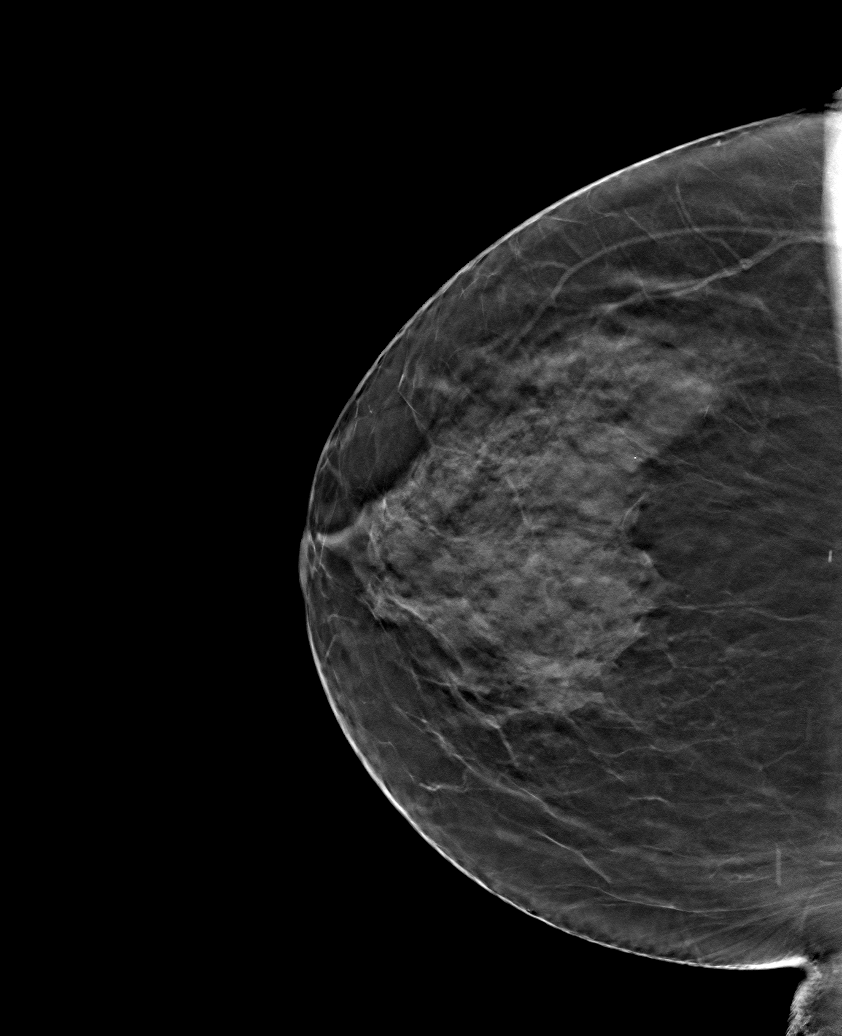

[6 of 30 positions shown; findings below may reference images not displayed]

ACR Breast Density Category c: The breast tissue is heterogeneously
dense, which may obscure small masses.
FINDINGS: There are no findings suspicious for malignancy. Images were
processed with CAD.
IMPRESSION: No mammographic evidence of malignancy. A result letter of this
screening mammogram will be mailed directly to the patient.

RECOMMENDATION:
Screening mammogram in one year. (Code:[id number])

BI-RADS CATEGORY  1: Negative.

## 2020-02-09 DIAGNOSIS — I10 Essential (primary) hypertension: Secondary | ICD-10-CM | POA: Diagnosis not present

## 2020-02-09 DIAGNOSIS — E7800 Pure hypercholesterolemia, unspecified: Secondary | ICD-10-CM | POA: Diagnosis not present

## 2020-03-08 DIAGNOSIS — Z6841 Body Mass Index (BMI) 40.0 and over, adult: Secondary | ICD-10-CM | POA: Diagnosis not present

## 2020-03-08 DIAGNOSIS — Z713 Dietary counseling and surveillance: Secondary | ICD-10-CM | POA: Diagnosis not present

## 2020-03-08 DIAGNOSIS — Z299 Encounter for prophylactic measures, unspecified: Secondary | ICD-10-CM | POA: Diagnosis not present

## 2020-03-08 DIAGNOSIS — I10 Essential (primary) hypertension: Secondary | ICD-10-CM | POA: Diagnosis not present

## 2020-04-10 DIAGNOSIS — I10 Essential (primary) hypertension: Secondary | ICD-10-CM | POA: Diagnosis not present

## 2020-04-10 DIAGNOSIS — E7800 Pure hypercholesterolemia, unspecified: Secondary | ICD-10-CM | POA: Diagnosis not present

## 2020-09-06 DIAGNOSIS — Z299 Encounter for prophylactic measures, unspecified: Secondary | ICD-10-CM | POA: Diagnosis not present

## 2020-09-06 DIAGNOSIS — M79604 Pain in right leg: Secondary | ICD-10-CM | POA: Diagnosis not present

## 2020-09-06 DIAGNOSIS — I10 Essential (primary) hypertension: Secondary | ICD-10-CM | POA: Diagnosis not present

## 2020-09-06 DIAGNOSIS — M79605 Pain in left leg: Secondary | ICD-10-CM | POA: Diagnosis not present

## 2020-09-12 DIAGNOSIS — M79662 Pain in left lower leg: Secondary | ICD-10-CM | POA: Diagnosis not present

## 2020-09-12 DIAGNOSIS — M79661 Pain in right lower leg: Secondary | ICD-10-CM | POA: Diagnosis not present

## 2020-09-12 DIAGNOSIS — M545 Low back pain, unspecified: Secondary | ICD-10-CM | POA: Diagnosis not present

## 2020-09-25 DIAGNOSIS — Z713 Dietary counseling and surveillance: Secondary | ICD-10-CM | POA: Diagnosis not present

## 2020-09-25 DIAGNOSIS — Z299 Encounter for prophylactic measures, unspecified: Secondary | ICD-10-CM | POA: Diagnosis not present

## 2020-09-25 DIAGNOSIS — I10 Essential (primary) hypertension: Secondary | ICD-10-CM | POA: Diagnosis not present

## 2020-09-25 DIAGNOSIS — Z6841 Body Mass Index (BMI) 40.0 and over, adult: Secondary | ICD-10-CM | POA: Diagnosis not present

## 2020-10-10 DIAGNOSIS — M2569 Stiffness of other specified joint, not elsewhere classified: Secondary | ICD-10-CM | POA: Diagnosis not present

## 2020-10-10 DIAGNOSIS — R2689 Other abnormalities of gait and mobility: Secondary | ICD-10-CM | POA: Diagnosis not present

## 2020-10-10 DIAGNOSIS — M545 Low back pain, unspecified: Secondary | ICD-10-CM | POA: Diagnosis not present

## 2020-10-11 DIAGNOSIS — Z008 Encounter for other general examination: Secondary | ICD-10-CM | POA: Diagnosis not present

## 2020-10-11 DIAGNOSIS — M1990 Unspecified osteoarthritis, unspecified site: Secondary | ICD-10-CM | POA: Diagnosis not present

## 2020-10-11 DIAGNOSIS — R609 Edema, unspecified: Secondary | ICD-10-CM | POA: Diagnosis not present

## 2020-10-11 DIAGNOSIS — G629 Polyneuropathy, unspecified: Secondary | ICD-10-CM | POA: Diagnosis not present

## 2020-10-11 DIAGNOSIS — I10 Essential (primary) hypertension: Secondary | ICD-10-CM | POA: Diagnosis not present

## 2020-10-11 DIAGNOSIS — Z8249 Family history of ischemic heart disease and other diseases of the circulatory system: Secondary | ICD-10-CM | POA: Diagnosis not present

## 2020-10-11 DIAGNOSIS — Z6841 Body Mass Index (BMI) 40.0 and over, adult: Secondary | ICD-10-CM | POA: Diagnosis not present

## 2020-10-11 DIAGNOSIS — Z791 Long term (current) use of non-steroidal anti-inflammatories (NSAID): Secondary | ICD-10-CM | POA: Diagnosis not present

## 2020-10-11 DIAGNOSIS — E785 Hyperlipidemia, unspecified: Secondary | ICD-10-CM | POA: Diagnosis not present

## 2020-10-11 DIAGNOSIS — R69 Illness, unspecified: Secondary | ICD-10-CM | POA: Diagnosis not present

## 2020-10-16 DIAGNOSIS — M545 Low back pain, unspecified: Secondary | ICD-10-CM | POA: Diagnosis not present

## 2020-10-16 DIAGNOSIS — R2689 Other abnormalities of gait and mobility: Secondary | ICD-10-CM | POA: Diagnosis not present

## 2020-10-16 DIAGNOSIS — M2569 Stiffness of other specified joint, not elsewhere classified: Secondary | ICD-10-CM | POA: Diagnosis not present

## 2020-10-19 DIAGNOSIS — M545 Low back pain, unspecified: Secondary | ICD-10-CM | POA: Diagnosis not present

## 2020-10-19 DIAGNOSIS — M2569 Stiffness of other specified joint, not elsewhere classified: Secondary | ICD-10-CM | POA: Diagnosis not present

## 2020-10-19 DIAGNOSIS — R2689 Other abnormalities of gait and mobility: Secondary | ICD-10-CM | POA: Diagnosis not present

## 2020-10-23 DIAGNOSIS — M2569 Stiffness of other specified joint, not elsewhere classified: Secondary | ICD-10-CM | POA: Diagnosis not present

## 2020-10-23 DIAGNOSIS — R2689 Other abnormalities of gait and mobility: Secondary | ICD-10-CM | POA: Diagnosis not present

## 2020-10-23 DIAGNOSIS — M545 Low back pain, unspecified: Secondary | ICD-10-CM | POA: Diagnosis not present

## 2020-10-25 DIAGNOSIS — R2689 Other abnormalities of gait and mobility: Secondary | ICD-10-CM | POA: Diagnosis not present

## 2020-10-25 DIAGNOSIS — M2569 Stiffness of other specified joint, not elsewhere classified: Secondary | ICD-10-CM | POA: Diagnosis not present

## 2020-10-25 DIAGNOSIS — M545 Low back pain, unspecified: Secondary | ICD-10-CM | POA: Diagnosis not present

## 2020-10-30 DIAGNOSIS — R2689 Other abnormalities of gait and mobility: Secondary | ICD-10-CM | POA: Diagnosis not present

## 2020-10-30 DIAGNOSIS — M545 Low back pain, unspecified: Secondary | ICD-10-CM | POA: Diagnosis not present

## 2020-10-30 DIAGNOSIS — M2569 Stiffness of other specified joint, not elsewhere classified: Secondary | ICD-10-CM | POA: Diagnosis not present

## 2020-11-01 DIAGNOSIS — M2569 Stiffness of other specified joint, not elsewhere classified: Secondary | ICD-10-CM | POA: Diagnosis not present

## 2020-11-01 DIAGNOSIS — R2689 Other abnormalities of gait and mobility: Secondary | ICD-10-CM | POA: Diagnosis not present

## 2020-11-01 DIAGNOSIS — M545 Low back pain, unspecified: Secondary | ICD-10-CM | POA: Diagnosis not present

## 2020-12-06 DIAGNOSIS — Z0000 Encounter for general adult medical examination without abnormal findings: Secondary | ICD-10-CM | POA: Diagnosis not present

## 2020-12-06 DIAGNOSIS — Z7189 Other specified counseling: Secondary | ICD-10-CM | POA: Diagnosis not present

## 2020-12-06 DIAGNOSIS — Z299 Encounter for prophylactic measures, unspecified: Secondary | ICD-10-CM | POA: Diagnosis not present

## 2020-12-06 DIAGNOSIS — Z1339 Encounter for screening examination for other mental health and behavioral disorders: Secondary | ICD-10-CM | POA: Diagnosis not present

## 2020-12-06 DIAGNOSIS — Z1331 Encounter for screening for depression: Secondary | ICD-10-CM | POA: Diagnosis not present

## 2020-12-06 DIAGNOSIS — R5383 Other fatigue: Secondary | ICD-10-CM | POA: Diagnosis not present

## 2020-12-06 DIAGNOSIS — Z6841 Body Mass Index (BMI) 40.0 and over, adult: Secondary | ICD-10-CM | POA: Diagnosis not present

## 2020-12-06 DIAGNOSIS — E7800 Pure hypercholesterolemia, unspecified: Secondary | ICD-10-CM | POA: Diagnosis not present

## 2020-12-06 DIAGNOSIS — Z79899 Other long term (current) drug therapy: Secondary | ICD-10-CM | POA: Diagnosis not present

## 2020-12-07 DIAGNOSIS — E559 Vitamin D deficiency, unspecified: Secondary | ICD-10-CM | POA: Diagnosis not present

## 2020-12-07 DIAGNOSIS — E7800 Pure hypercholesterolemia, unspecified: Secondary | ICD-10-CM | POA: Diagnosis not present

## 2020-12-07 DIAGNOSIS — R5383 Other fatigue: Secondary | ICD-10-CM | POA: Diagnosis not present

## 2020-12-07 DIAGNOSIS — Z0000 Encounter for general adult medical examination without abnormal findings: Secondary | ICD-10-CM | POA: Diagnosis not present

## 2020-12-07 DIAGNOSIS — Z79899 Other long term (current) drug therapy: Secondary | ICD-10-CM | POA: Diagnosis not present

## 2020-12-13 DIAGNOSIS — Z6841 Body Mass Index (BMI) 40.0 and over, adult: Secondary | ICD-10-CM | POA: Diagnosis not present

## 2020-12-13 DIAGNOSIS — Z789 Other specified health status: Secondary | ICD-10-CM | POA: Diagnosis not present

## 2020-12-13 DIAGNOSIS — Z299 Encounter for prophylactic measures, unspecified: Secondary | ICD-10-CM | POA: Diagnosis not present

## 2020-12-13 DIAGNOSIS — H6123 Impacted cerumen, bilateral: Secondary | ICD-10-CM | POA: Diagnosis not present

## 2020-12-13 DIAGNOSIS — I10 Essential (primary) hypertension: Secondary | ICD-10-CM | POA: Diagnosis not present

## 2020-12-25 DIAGNOSIS — Z1231 Encounter for screening mammogram for malignant neoplasm of breast: Secondary | ICD-10-CM | POA: Diagnosis not present

## 2020-12-31 DIAGNOSIS — Z79899 Other long term (current) drug therapy: Secondary | ICD-10-CM | POA: Diagnosis not present

## 2020-12-31 DIAGNOSIS — M859 Disorder of bone density and structure, unspecified: Secondary | ICD-10-CM | POA: Diagnosis not present

## 2020-12-31 DIAGNOSIS — E2839 Other primary ovarian failure: Secondary | ICD-10-CM | POA: Diagnosis not present

## 2021-01-21 DIAGNOSIS — Z1231 Encounter for screening mammogram for malignant neoplasm of breast: Secondary | ICD-10-CM | POA: Diagnosis not present

## 2021-02-19 DIAGNOSIS — M9902 Segmental and somatic dysfunction of thoracic region: Secondary | ICD-10-CM | POA: Diagnosis not present

## 2021-02-19 DIAGNOSIS — M9905 Segmental and somatic dysfunction of pelvic region: Secondary | ICD-10-CM | POA: Diagnosis not present

## 2021-02-19 DIAGNOSIS — M9903 Segmental and somatic dysfunction of lumbar region: Secondary | ICD-10-CM | POA: Diagnosis not present

## 2021-02-28 DIAGNOSIS — M9903 Segmental and somatic dysfunction of lumbar region: Secondary | ICD-10-CM | POA: Diagnosis not present

## 2021-02-28 DIAGNOSIS — M9902 Segmental and somatic dysfunction of thoracic region: Secondary | ICD-10-CM | POA: Diagnosis not present

## 2021-02-28 DIAGNOSIS — M9905 Segmental and somatic dysfunction of pelvic region: Secondary | ICD-10-CM | POA: Diagnosis not present

## 2021-03-06 DIAGNOSIS — M9902 Segmental and somatic dysfunction of thoracic region: Secondary | ICD-10-CM | POA: Diagnosis not present

## 2021-03-06 DIAGNOSIS — M9905 Segmental and somatic dysfunction of pelvic region: Secondary | ICD-10-CM | POA: Diagnosis not present

## 2021-03-06 DIAGNOSIS — M9903 Segmental and somatic dysfunction of lumbar region: Secondary | ICD-10-CM | POA: Diagnosis not present

## 2021-03-20 DIAGNOSIS — M9905 Segmental and somatic dysfunction of pelvic region: Secondary | ICD-10-CM | POA: Diagnosis not present

## 2021-03-20 DIAGNOSIS — M9903 Segmental and somatic dysfunction of lumbar region: Secondary | ICD-10-CM | POA: Diagnosis not present

## 2021-03-20 DIAGNOSIS — M9902 Segmental and somatic dysfunction of thoracic region: Secondary | ICD-10-CM | POA: Diagnosis not present

## 2021-04-10 DIAGNOSIS — M9902 Segmental and somatic dysfunction of thoracic region: Secondary | ICD-10-CM | POA: Diagnosis not present

## 2021-04-10 DIAGNOSIS — M9905 Segmental and somatic dysfunction of pelvic region: Secondary | ICD-10-CM | POA: Diagnosis not present

## 2021-04-10 DIAGNOSIS — M9903 Segmental and somatic dysfunction of lumbar region: Secondary | ICD-10-CM | POA: Diagnosis not present

## 2021-07-11 DIAGNOSIS — I739 Peripheral vascular disease, unspecified: Secondary | ICD-10-CM | POA: Diagnosis not present

## 2021-07-11 DIAGNOSIS — Z008 Encounter for other general examination: Secondary | ICD-10-CM | POA: Diagnosis not present

## 2021-07-11 DIAGNOSIS — Z791 Long term (current) use of non-steroidal anti-inflammatories (NSAID): Secondary | ICD-10-CM | POA: Diagnosis not present

## 2021-07-11 DIAGNOSIS — Z811 Family history of alcohol abuse and dependence: Secondary | ICD-10-CM | POA: Diagnosis not present

## 2021-07-11 DIAGNOSIS — I10 Essential (primary) hypertension: Secondary | ICD-10-CM | POA: Diagnosis not present

## 2021-07-11 DIAGNOSIS — M545 Low back pain, unspecified: Secondary | ICD-10-CM | POA: Diagnosis not present

## 2021-07-11 DIAGNOSIS — G629 Polyneuropathy, unspecified: Secondary | ICD-10-CM | POA: Diagnosis not present

## 2021-07-11 DIAGNOSIS — E785 Hyperlipidemia, unspecified: Secondary | ICD-10-CM | POA: Diagnosis not present

## 2021-07-11 DIAGNOSIS — Z6841 Body Mass Index (BMI) 40.0 and over, adult: Secondary | ICD-10-CM | POA: Diagnosis not present

## 2021-07-11 DIAGNOSIS — R609 Edema, unspecified: Secondary | ICD-10-CM | POA: Diagnosis not present

## 2021-07-11 DIAGNOSIS — G8929 Other chronic pain: Secondary | ICD-10-CM | POA: Diagnosis not present

## 2021-07-11 DIAGNOSIS — M1990 Unspecified osteoarthritis, unspecified site: Secondary | ICD-10-CM | POA: Diagnosis not present

## 2021-07-31 DIAGNOSIS — I10 Essential (primary) hypertension: Secondary | ICD-10-CM | POA: Diagnosis not present

## 2021-07-31 DIAGNOSIS — Z789 Other specified health status: Secondary | ICD-10-CM | POA: Diagnosis not present

## 2021-07-31 DIAGNOSIS — Z299 Encounter for prophylactic measures, unspecified: Secondary | ICD-10-CM | POA: Diagnosis not present

## 2021-07-31 DIAGNOSIS — I739 Peripheral vascular disease, unspecified: Secondary | ICD-10-CM | POA: Diagnosis not present

## 2021-07-31 DIAGNOSIS — E7800 Pure hypercholesterolemia, unspecified: Secondary | ICD-10-CM | POA: Diagnosis not present

## 2021-08-19 DIAGNOSIS — H524 Presbyopia: Secondary | ICD-10-CM | POA: Diagnosis not present

## 2021-08-19 DIAGNOSIS — E7800 Pure hypercholesterolemia, unspecified: Secondary | ICD-10-CM | POA: Diagnosis not present

## 2021-08-19 DIAGNOSIS — Z0100 Encounter for examination of eyes and vision without abnormal findings: Secondary | ICD-10-CM | POA: Diagnosis not present

## 2021-08-19 DIAGNOSIS — I739 Peripheral vascular disease, unspecified: Secondary | ICD-10-CM | POA: Diagnosis not present

## 2021-08-19 DIAGNOSIS — I10 Essential (primary) hypertension: Secondary | ICD-10-CM | POA: Diagnosis not present

## 2021-09-03 DIAGNOSIS — Z299 Encounter for prophylactic measures, unspecified: Secondary | ICD-10-CM | POA: Diagnosis not present

## 2021-09-03 DIAGNOSIS — I10 Essential (primary) hypertension: Secondary | ICD-10-CM | POA: Diagnosis not present

## 2021-09-03 DIAGNOSIS — Z789 Other specified health status: Secondary | ICD-10-CM | POA: Diagnosis not present

## 2021-09-03 DIAGNOSIS — R609 Edema, unspecified: Secondary | ICD-10-CM | POA: Diagnosis not present

## 2021-10-01 DIAGNOSIS — I872 Venous insufficiency (chronic) (peripheral): Secondary | ICD-10-CM | POA: Diagnosis not present

## 2021-10-01 DIAGNOSIS — E782 Mixed hyperlipidemia: Secondary | ICD-10-CM | POA: Diagnosis not present

## 2021-10-01 DIAGNOSIS — R600 Localized edema: Secondary | ICD-10-CM | POA: Diagnosis not present

## 2021-10-01 DIAGNOSIS — R29818 Other symptoms and signs involving the nervous system: Secondary | ICD-10-CM | POA: Diagnosis not present

## 2021-10-01 DIAGNOSIS — Z789 Other specified health status: Secondary | ICD-10-CM | POA: Diagnosis not present

## 2021-10-01 DIAGNOSIS — I10 Essential (primary) hypertension: Secondary | ICD-10-CM | POA: Diagnosis not present

## 2021-10-01 DIAGNOSIS — R0602 Shortness of breath: Secondary | ICD-10-CM | POA: Diagnosis not present

## 2021-10-08 DIAGNOSIS — M170 Bilateral primary osteoarthritis of knee: Secondary | ICD-10-CM | POA: Diagnosis not present

## 2021-10-10 DIAGNOSIS — M1712 Unilateral primary osteoarthritis, left knee: Secondary | ICD-10-CM | POA: Diagnosis not present

## 2021-10-15 DIAGNOSIS — R0602 Shortness of breath: Secondary | ICD-10-CM | POA: Diagnosis not present

## 2021-10-15 DIAGNOSIS — R600 Localized edema: Secondary | ICD-10-CM | POA: Diagnosis not present

## 2021-10-15 DIAGNOSIS — I071 Rheumatic tricuspid insufficiency: Secondary | ICD-10-CM | POA: Diagnosis not present

## 2021-10-15 DIAGNOSIS — I3481 Nonrheumatic mitral (valve) annulus calcification: Secondary | ICD-10-CM | POA: Diagnosis not present

## 2021-10-15 DIAGNOSIS — R29818 Other symptoms and signs involving the nervous system: Secondary | ICD-10-CM | POA: Diagnosis not present

## 2021-10-15 DIAGNOSIS — R0600 Dyspnea, unspecified: Secondary | ICD-10-CM | POA: Diagnosis not present

## 2021-10-15 DIAGNOSIS — I519 Heart disease, unspecified: Secondary | ICD-10-CM | POA: Diagnosis not present

## 2021-10-15 DIAGNOSIS — I10 Essential (primary) hypertension: Secondary | ICD-10-CM | POA: Diagnosis not present

## 2021-10-31 DIAGNOSIS — M792 Neuralgia and neuritis, unspecified: Secondary | ICD-10-CM | POA: Diagnosis not present

## 2021-10-31 DIAGNOSIS — M79672 Pain in left foot: Secondary | ICD-10-CM | POA: Diagnosis not present

## 2021-10-31 DIAGNOSIS — M79671 Pain in right foot: Secondary | ICD-10-CM | POA: Diagnosis not present

## 2021-11-21 DIAGNOSIS — M79671 Pain in right foot: Secondary | ICD-10-CM | POA: Diagnosis not present

## 2021-11-21 DIAGNOSIS — M79672 Pain in left foot: Secondary | ICD-10-CM | POA: Diagnosis not present

## 2021-11-21 DIAGNOSIS — M792 Neuralgia and neuritis, unspecified: Secondary | ICD-10-CM | POA: Diagnosis not present

## 2021-12-11 DIAGNOSIS — Z6841 Body Mass Index (BMI) 40.0 and over, adult: Secondary | ICD-10-CM | POA: Diagnosis not present

## 2021-12-11 DIAGNOSIS — Z0000 Encounter for general adult medical examination without abnormal findings: Secondary | ICD-10-CM | POA: Diagnosis not present

## 2021-12-11 DIAGNOSIS — E7800 Pure hypercholesterolemia, unspecified: Secondary | ICD-10-CM | POA: Diagnosis not present

## 2021-12-11 DIAGNOSIS — Z1339 Encounter for screening examination for other mental health and behavioral disorders: Secondary | ICD-10-CM | POA: Diagnosis not present

## 2021-12-11 DIAGNOSIS — I10 Essential (primary) hypertension: Secondary | ICD-10-CM | POA: Diagnosis not present

## 2021-12-11 DIAGNOSIS — Z789 Other specified health status: Secondary | ICD-10-CM | POA: Diagnosis not present

## 2021-12-11 DIAGNOSIS — Z1331 Encounter for screening for depression: Secondary | ICD-10-CM | POA: Diagnosis not present

## 2021-12-11 DIAGNOSIS — E559 Vitamin D deficiency, unspecified: Secondary | ICD-10-CM | POA: Diagnosis not present

## 2021-12-11 DIAGNOSIS — Z7189 Other specified counseling: Secondary | ICD-10-CM | POA: Diagnosis not present

## 2021-12-11 DIAGNOSIS — R5383 Other fatigue: Secondary | ICD-10-CM | POA: Diagnosis not present

## 2021-12-11 DIAGNOSIS — Z79899 Other long term (current) drug therapy: Secondary | ICD-10-CM | POA: Diagnosis not present

## 2021-12-11 DIAGNOSIS — Z299 Encounter for prophylactic measures, unspecified: Secondary | ICD-10-CM | POA: Diagnosis not present

## 2021-12-19 DIAGNOSIS — M79671 Pain in right foot: Secondary | ICD-10-CM | POA: Diagnosis not present

## 2021-12-19 DIAGNOSIS — M25579 Pain in unspecified ankle and joints of unspecified foot: Secondary | ICD-10-CM | POA: Diagnosis not present

## 2021-12-19 DIAGNOSIS — M79672 Pain in left foot: Secondary | ICD-10-CM | POA: Diagnosis not present

## 2021-12-25 DIAGNOSIS — I10 Essential (primary) hypertension: Secondary | ICD-10-CM | POA: Diagnosis not present

## 2021-12-25 DIAGNOSIS — Z789 Other specified health status: Secondary | ICD-10-CM | POA: Diagnosis not present

## 2021-12-25 DIAGNOSIS — Z0000 Encounter for general adult medical examination without abnormal findings: Secondary | ICD-10-CM | POA: Diagnosis not present

## 2021-12-25 DIAGNOSIS — Z299 Encounter for prophylactic measures, unspecified: Secondary | ICD-10-CM | POA: Diagnosis not present

## 2021-12-25 DIAGNOSIS — Z6841 Body Mass Index (BMI) 40.0 and over, adult: Secondary | ICD-10-CM | POA: Diagnosis not present

## 2022-01-01 DIAGNOSIS — M7052 Other bursitis of knee, left knee: Secondary | ICD-10-CM | POA: Diagnosis not present

## 2022-01-01 DIAGNOSIS — S83402A Sprain of unspecified collateral ligament of left knee, initial encounter: Secondary | ICD-10-CM | POA: Diagnosis not present

## 2022-01-07 DIAGNOSIS — S83402A Sprain of unspecified collateral ligament of left knee, initial encounter: Secondary | ICD-10-CM | POA: Diagnosis not present

## 2022-01-07 DIAGNOSIS — M7052 Other bursitis of knee, left knee: Secondary | ICD-10-CM | POA: Diagnosis not present

## 2022-01-09 DIAGNOSIS — S83402A Sprain of unspecified collateral ligament of left knee, initial encounter: Secondary | ICD-10-CM | POA: Diagnosis not present

## 2022-01-09 DIAGNOSIS — M7052 Other bursitis of knee, left knee: Secondary | ICD-10-CM | POA: Diagnosis not present

## 2022-01-14 DIAGNOSIS — M7052 Other bursitis of knee, left knee: Secondary | ICD-10-CM | POA: Diagnosis not present

## 2022-01-14 DIAGNOSIS — S83402A Sprain of unspecified collateral ligament of left knee, initial encounter: Secondary | ICD-10-CM | POA: Diagnosis not present

## 2022-01-16 DIAGNOSIS — M7052 Other bursitis of knee, left knee: Secondary | ICD-10-CM | POA: Diagnosis not present

## 2022-01-16 DIAGNOSIS — S83402A Sprain of unspecified collateral ligament of left knee, initial encounter: Secondary | ICD-10-CM | POA: Diagnosis not present

## 2022-01-21 DIAGNOSIS — M7052 Other bursitis of knee, left knee: Secondary | ICD-10-CM | POA: Diagnosis not present

## 2022-01-21 DIAGNOSIS — S83402A Sprain of unspecified collateral ligament of left knee, initial encounter: Secondary | ICD-10-CM | POA: Diagnosis not present

## 2022-01-23 DIAGNOSIS — S83402A Sprain of unspecified collateral ligament of left knee, initial encounter: Secondary | ICD-10-CM | POA: Diagnosis not present

## 2022-01-23 DIAGNOSIS — Z1231 Encounter for screening mammogram for malignant neoplasm of breast: Secondary | ICD-10-CM | POA: Diagnosis not present

## 2022-01-23 DIAGNOSIS — M7052 Other bursitis of knee, left knee: Secondary | ICD-10-CM | POA: Diagnosis not present

## 2022-01-28 DIAGNOSIS — M7052 Other bursitis of knee, left knee: Secondary | ICD-10-CM | POA: Diagnosis not present

## 2022-01-28 DIAGNOSIS — S83402A Sprain of unspecified collateral ligament of left knee, initial encounter: Secondary | ICD-10-CM | POA: Diagnosis not present

## 2022-01-29 DIAGNOSIS — S83402A Sprain of unspecified collateral ligament of left knee, initial encounter: Secondary | ICD-10-CM | POA: Diagnosis not present

## 2022-01-29 DIAGNOSIS — M7052 Other bursitis of knee, left knee: Secondary | ICD-10-CM | POA: Diagnosis not present

## 2022-01-30 DIAGNOSIS — M25559 Pain in unspecified hip: Secondary | ICD-10-CM | POA: Diagnosis not present

## 2022-01-30 DIAGNOSIS — I10 Essential (primary) hypertension: Secondary | ICD-10-CM | POA: Diagnosis not present

## 2022-01-30 DIAGNOSIS — Z789 Other specified health status: Secondary | ICD-10-CM | POA: Diagnosis not present

## 2022-01-30 DIAGNOSIS — M25569 Pain in unspecified knee: Secondary | ICD-10-CM | POA: Diagnosis not present

## 2022-01-30 DIAGNOSIS — Z299 Encounter for prophylactic measures, unspecified: Secondary | ICD-10-CM | POA: Diagnosis not present

## 2022-02-06 DIAGNOSIS — M1712 Unilateral primary osteoarthritis, left knee: Secondary | ICD-10-CM | POA: Diagnosis not present

## 2022-02-12 DIAGNOSIS — S83402A Sprain of unspecified collateral ligament of left knee, initial encounter: Secondary | ICD-10-CM | POA: Diagnosis not present

## 2022-02-12 DIAGNOSIS — M7052 Other bursitis of knee, left knee: Secondary | ICD-10-CM | POA: Diagnosis not present

## 2022-02-14 DIAGNOSIS — M7052 Other bursitis of knee, left knee: Secondary | ICD-10-CM | POA: Diagnosis not present

## 2022-02-14 DIAGNOSIS — S83402A Sprain of unspecified collateral ligament of left knee, initial encounter: Secondary | ICD-10-CM | POA: Diagnosis not present

## 2022-02-17 DIAGNOSIS — S83402A Sprain of unspecified collateral ligament of left knee, initial encounter: Secondary | ICD-10-CM | POA: Diagnosis not present

## 2022-02-17 DIAGNOSIS — M7052 Other bursitis of knee, left knee: Secondary | ICD-10-CM | POA: Diagnosis not present

## 2022-02-19 DIAGNOSIS — M7052 Other bursitis of knee, left knee: Secondary | ICD-10-CM | POA: Diagnosis not present

## 2022-02-19 DIAGNOSIS — S83402A Sprain of unspecified collateral ligament of left knee, initial encounter: Secondary | ICD-10-CM | POA: Diagnosis not present

## 2022-02-24 DIAGNOSIS — S83402A Sprain of unspecified collateral ligament of left knee, initial encounter: Secondary | ICD-10-CM | POA: Diagnosis not present

## 2022-02-24 DIAGNOSIS — M7052 Other bursitis of knee, left knee: Secondary | ICD-10-CM | POA: Diagnosis not present

## 2022-02-26 DIAGNOSIS — M7052 Other bursitis of knee, left knee: Secondary | ICD-10-CM | POA: Diagnosis not present

## 2022-02-26 DIAGNOSIS — S83402A Sprain of unspecified collateral ligament of left knee, initial encounter: Secondary | ICD-10-CM | POA: Diagnosis not present

## 2022-03-03 DIAGNOSIS — S83402A Sprain of unspecified collateral ligament of left knee, initial encounter: Secondary | ICD-10-CM | POA: Diagnosis not present

## 2022-03-03 DIAGNOSIS — M7052 Other bursitis of knee, left knee: Secondary | ICD-10-CM | POA: Diagnosis not present

## 2022-03-05 DIAGNOSIS — M7052 Other bursitis of knee, left knee: Secondary | ICD-10-CM | POA: Diagnosis not present

## 2022-03-05 DIAGNOSIS — S83402A Sprain of unspecified collateral ligament of left knee, initial encounter: Secondary | ICD-10-CM | POA: Diagnosis not present

## 2022-03-11 DIAGNOSIS — M7052 Other bursitis of knee, left knee: Secondary | ICD-10-CM | POA: Diagnosis not present

## 2022-03-11 DIAGNOSIS — S83402A Sprain of unspecified collateral ligament of left knee, initial encounter: Secondary | ICD-10-CM | POA: Diagnosis not present

## 2022-03-17 DIAGNOSIS — M7052 Other bursitis of knee, left knee: Secondary | ICD-10-CM | POA: Diagnosis not present

## 2022-03-17 DIAGNOSIS — S83402A Sprain of unspecified collateral ligament of left knee, initial encounter: Secondary | ICD-10-CM | POA: Diagnosis not present

## 2022-03-24 DIAGNOSIS — S83402A Sprain of unspecified collateral ligament of left knee, initial encounter: Secondary | ICD-10-CM | POA: Diagnosis not present

## 2022-03-24 DIAGNOSIS — M7052 Other bursitis of knee, left knee: Secondary | ICD-10-CM | POA: Diagnosis not present

## 2022-04-01 DIAGNOSIS — I10 Essential (primary) hypertension: Secondary | ICD-10-CM | POA: Diagnosis not present

## 2022-04-01 DIAGNOSIS — Z789 Other specified health status: Secondary | ICD-10-CM | POA: Diagnosis not present

## 2022-04-01 DIAGNOSIS — Z299 Encounter for prophylactic measures, unspecified: Secondary | ICD-10-CM | POA: Diagnosis not present

## 2022-04-10 DIAGNOSIS — M1712 Unilateral primary osteoarthritis, left knee: Secondary | ICD-10-CM | POA: Diagnosis not present

## 2022-06-10 DIAGNOSIS — M79671 Pain in right foot: Secondary | ICD-10-CM | POA: Diagnosis not present

## 2022-06-10 DIAGNOSIS — G600 Hereditary motor and sensory neuropathy: Secondary | ICD-10-CM | POA: Diagnosis not present

## 2022-06-10 DIAGNOSIS — M792 Neuralgia and neuritis, unspecified: Secondary | ICD-10-CM | POA: Diagnosis not present

## 2022-06-10 DIAGNOSIS — M79672 Pain in left foot: Secondary | ICD-10-CM | POA: Diagnosis not present

## 2022-07-01 DIAGNOSIS — G600 Hereditary motor and sensory neuropathy: Secondary | ICD-10-CM | POA: Diagnosis not present

## 2022-07-01 DIAGNOSIS — M79671 Pain in right foot: Secondary | ICD-10-CM | POA: Diagnosis not present

## 2022-07-01 DIAGNOSIS — M79672 Pain in left foot: Secondary | ICD-10-CM | POA: Diagnosis not present

## 2022-07-01 DIAGNOSIS — M792 Neuralgia and neuritis, unspecified: Secondary | ICD-10-CM | POA: Diagnosis not present

## 2022-07-02 DIAGNOSIS — Z299 Encounter for prophylactic measures, unspecified: Secondary | ICD-10-CM | POA: Diagnosis not present

## 2022-07-02 DIAGNOSIS — I10 Essential (primary) hypertension: Secondary | ICD-10-CM | POA: Diagnosis not present

## 2022-07-02 DIAGNOSIS — H6121 Impacted cerumen, right ear: Secondary | ICD-10-CM | POA: Diagnosis not present

## 2022-07-29 DIAGNOSIS — M79672 Pain in left foot: Secondary | ICD-10-CM | POA: Diagnosis not present

## 2022-07-29 DIAGNOSIS — E1140 Type 2 diabetes mellitus with diabetic neuropathy, unspecified: Secondary | ICD-10-CM | POA: Diagnosis not present

## 2022-07-29 DIAGNOSIS — M79671 Pain in right foot: Secondary | ICD-10-CM | POA: Diagnosis not present

## 2022-07-29 DIAGNOSIS — M792 Neuralgia and neuritis, unspecified: Secondary | ICD-10-CM | POA: Diagnosis not present

## 2022-08-28 DIAGNOSIS — M79671 Pain in right foot: Secondary | ICD-10-CM | POA: Diagnosis not present

## 2022-08-28 DIAGNOSIS — M79672 Pain in left foot: Secondary | ICD-10-CM | POA: Diagnosis not present

## 2022-08-28 DIAGNOSIS — G600 Hereditary motor and sensory neuropathy: Secondary | ICD-10-CM | POA: Diagnosis not present

## 2022-08-28 DIAGNOSIS — M792 Neuralgia and neuritis, unspecified: Secondary | ICD-10-CM | POA: Diagnosis not present

## 2022-09-25 DIAGNOSIS — M79672 Pain in left foot: Secondary | ICD-10-CM | POA: Diagnosis not present

## 2022-09-25 DIAGNOSIS — M79671 Pain in right foot: Secondary | ICD-10-CM | POA: Diagnosis not present

## 2022-09-25 DIAGNOSIS — M792 Neuralgia and neuritis, unspecified: Secondary | ICD-10-CM | POA: Diagnosis not present

## 2022-10-07 DIAGNOSIS — R531 Weakness: Secondary | ICD-10-CM | POA: Diagnosis not present

## 2022-10-07 DIAGNOSIS — Z299 Encounter for prophylactic measures, unspecified: Secondary | ICD-10-CM | POA: Diagnosis not present

## 2022-10-07 DIAGNOSIS — I10 Essential (primary) hypertension: Secondary | ICD-10-CM | POA: Diagnosis not present

## 2022-10-20 DIAGNOSIS — G8929 Other chronic pain: Secondary | ICD-10-CM | POA: Diagnosis not present

## 2022-10-20 DIAGNOSIS — Z743 Need for continuous supervision: Secondary | ICD-10-CM | POA: Diagnosis not present

## 2022-10-20 DIAGNOSIS — M25569 Pain in unspecified knee: Secondary | ICD-10-CM | POA: Diagnosis not present

## 2022-10-20 DIAGNOSIS — M1990 Unspecified osteoarthritis, unspecified site: Secondary | ICD-10-CM | POA: Diagnosis not present

## 2022-10-20 DIAGNOSIS — W19XXXA Unspecified fall, initial encounter: Secondary | ICD-10-CM | POA: Diagnosis not present

## 2022-10-20 DIAGNOSIS — Z87891 Personal history of nicotine dependence: Secondary | ICD-10-CM | POA: Diagnosis not present

## 2022-10-20 DIAGNOSIS — Z79899 Other long term (current) drug therapy: Secondary | ICD-10-CM | POA: Diagnosis not present

## 2022-10-20 DIAGNOSIS — W1839XA Other fall on same level, initial encounter: Secondary | ICD-10-CM | POA: Diagnosis not present

## 2022-10-20 DIAGNOSIS — M25562 Pain in left knee: Secondary | ICD-10-CM | POA: Diagnosis not present

## 2022-10-20 DIAGNOSIS — E785 Hyperlipidemia, unspecified: Secondary | ICD-10-CM | POA: Diagnosis not present

## 2022-10-20 DIAGNOSIS — I10 Essential (primary) hypertension: Secondary | ICD-10-CM | POA: Diagnosis not present

## 2022-10-28 DIAGNOSIS — M79672 Pain in left foot: Secondary | ICD-10-CM | POA: Diagnosis not present

## 2022-10-28 DIAGNOSIS — M79671 Pain in right foot: Secondary | ICD-10-CM | POA: Diagnosis not present

## 2022-10-28 DIAGNOSIS — M792 Neuralgia and neuritis, unspecified: Secondary | ICD-10-CM | POA: Diagnosis not present

## 2022-10-30 DIAGNOSIS — I10 Essential (primary) hypertension: Secondary | ICD-10-CM | POA: Diagnosis not present

## 2022-10-30 DIAGNOSIS — Z7689 Persons encountering health services in other specified circumstances: Secondary | ICD-10-CM | POA: Diagnosis not present

## 2022-10-30 DIAGNOSIS — Z299 Encounter for prophylactic measures, unspecified: Secondary | ICD-10-CM | POA: Diagnosis not present

## 2022-10-30 DIAGNOSIS — M179 Osteoarthritis of knee, unspecified: Secondary | ICD-10-CM | POA: Diagnosis not present

## 2022-11-26 DIAGNOSIS — I10 Essential (primary) hypertension: Secondary | ICD-10-CM | POA: Diagnosis not present

## 2022-11-26 DIAGNOSIS — Z1331 Encounter for screening for depression: Secondary | ICD-10-CM | POA: Diagnosis not present

## 2022-11-26 DIAGNOSIS — Z299 Encounter for prophylactic measures, unspecified: Secondary | ICD-10-CM | POA: Diagnosis not present

## 2022-11-26 DIAGNOSIS — Z7189 Other specified counseling: Secondary | ICD-10-CM | POA: Diagnosis not present

## 2022-11-26 DIAGNOSIS — R5383 Other fatigue: Secondary | ICD-10-CM | POA: Diagnosis not present

## 2022-11-26 DIAGNOSIS — Z1339 Encounter for screening examination for other mental health and behavioral disorders: Secondary | ICD-10-CM | POA: Diagnosis not present

## 2022-11-26 DIAGNOSIS — E7800 Pure hypercholesterolemia, unspecified: Secondary | ICD-10-CM | POA: Diagnosis not present

## 2022-11-26 DIAGNOSIS — Z79899 Other long term (current) drug therapy: Secondary | ICD-10-CM | POA: Diagnosis not present

## 2022-11-26 DIAGNOSIS — E559 Vitamin D deficiency, unspecified: Secondary | ICD-10-CM | POA: Diagnosis not present

## 2022-11-26 DIAGNOSIS — Z0000 Encounter for general adult medical examination without abnormal findings: Secondary | ICD-10-CM | POA: Diagnosis not present

## 2022-12-02 DIAGNOSIS — G5790 Unspecified mononeuropathy of unspecified lower limb: Secondary | ICD-10-CM | POA: Diagnosis not present

## 2022-12-02 DIAGNOSIS — M792 Neuralgia and neuritis, unspecified: Secondary | ICD-10-CM | POA: Diagnosis not present

## 2022-12-02 DIAGNOSIS — M79671 Pain in right foot: Secondary | ICD-10-CM | POA: Diagnosis not present

## 2022-12-02 DIAGNOSIS — M79672 Pain in left foot: Secondary | ICD-10-CM | POA: Diagnosis not present

## 2022-12-18 DIAGNOSIS — Z6841 Body Mass Index (BMI) 40.0 and over, adult: Secondary | ICD-10-CM | POA: Diagnosis not present

## 2022-12-18 DIAGNOSIS — I10 Essential (primary) hypertension: Secondary | ICD-10-CM | POA: Diagnosis not present

## 2022-12-18 DIAGNOSIS — Z0000 Encounter for general adult medical examination without abnormal findings: Secondary | ICD-10-CM | POA: Diagnosis not present

## 2022-12-18 DIAGNOSIS — Z299 Encounter for prophylactic measures, unspecified: Secondary | ICD-10-CM | POA: Diagnosis not present

## 2022-12-23 ENCOUNTER — Other Ambulatory Visit: Payer: Self-pay | Admitting: Internal Medicine

## 2022-12-23 DIAGNOSIS — Z1231 Encounter for screening mammogram for malignant neoplasm of breast: Secondary | ICD-10-CM

## 2022-12-23 DIAGNOSIS — Z1211 Encounter for screening for malignant neoplasm of colon: Secondary | ICD-10-CM | POA: Diagnosis not present

## 2022-12-30 DIAGNOSIS — M79671 Pain in right foot: Secondary | ICD-10-CM | POA: Diagnosis not present

## 2022-12-30 DIAGNOSIS — M79672 Pain in left foot: Secondary | ICD-10-CM | POA: Diagnosis not present

## 2022-12-30 DIAGNOSIS — E1140 Type 2 diabetes mellitus with diabetic neuropathy, unspecified: Secondary | ICD-10-CM | POA: Diagnosis not present

## 2022-12-30 DIAGNOSIS — M792 Neuralgia and neuritis, unspecified: Secondary | ICD-10-CM | POA: Diagnosis not present

## 2023-01-14 DIAGNOSIS — K5730 Diverticulosis of large intestine without perforation or abscess without bleeding: Secondary | ICD-10-CM | POA: Diagnosis not present

## 2023-01-14 DIAGNOSIS — Z1211 Encounter for screening for malignant neoplasm of colon: Secondary | ICD-10-CM | POA: Diagnosis not present

## 2023-01-16 DIAGNOSIS — Z87891 Personal history of nicotine dependence: Secondary | ICD-10-CM | POA: Diagnosis not present

## 2023-01-16 DIAGNOSIS — Z6841 Body Mass Index (BMI) 40.0 and over, adult: Secondary | ICD-10-CM | POA: Diagnosis not present

## 2023-01-16 DIAGNOSIS — Z79899 Other long term (current) drug therapy: Secondary | ICD-10-CM | POA: Diagnosis not present

## 2023-01-16 DIAGNOSIS — Z791 Long term (current) use of non-steroidal anti-inflammatories (NSAID): Secondary | ICD-10-CM | POA: Diagnosis not present

## 2023-01-16 DIAGNOSIS — E785 Hyperlipidemia, unspecified: Secondary | ICD-10-CM | POA: Diagnosis not present

## 2023-01-16 DIAGNOSIS — M5416 Radiculopathy, lumbar region: Secondary | ICD-10-CM | POA: Diagnosis not present

## 2023-01-16 DIAGNOSIS — K5730 Diverticulosis of large intestine without perforation or abscess without bleeding: Secondary | ICD-10-CM | POA: Diagnosis not present

## 2023-01-16 DIAGNOSIS — I10 Essential (primary) hypertension: Secondary | ICD-10-CM | POA: Diagnosis not present

## 2023-01-16 DIAGNOSIS — Z1211 Encounter for screening for malignant neoplasm of colon: Secondary | ICD-10-CM | POA: Diagnosis not present

## 2023-01-19 DIAGNOSIS — Z79899 Other long term (current) drug therapy: Secondary | ICD-10-CM | POA: Diagnosis not present

## 2023-01-19 DIAGNOSIS — Z13820 Encounter for screening for osteoporosis: Secondary | ICD-10-CM | POA: Diagnosis not present

## 2023-01-27 DIAGNOSIS — E1140 Type 2 diabetes mellitus with diabetic neuropathy, unspecified: Secondary | ICD-10-CM | POA: Diagnosis not present

## 2023-01-27 DIAGNOSIS — M79671 Pain in right foot: Secondary | ICD-10-CM | POA: Diagnosis not present

## 2023-01-27 DIAGNOSIS — M79672 Pain in left foot: Secondary | ICD-10-CM | POA: Diagnosis not present

## 2023-01-27 DIAGNOSIS — M792 Neuralgia and neuritis, unspecified: Secondary | ICD-10-CM | POA: Diagnosis not present

## 2023-02-06 ENCOUNTER — Ambulatory Visit
Admission: RE | Admit: 2023-02-06 | Discharge: 2023-02-06 | Disposition: A | Discharge status: Home / Self Care | Payer: Medicare HMO | Source: Ambulatory Visit | Attending: Internal Medicine | Admitting: Internal Medicine

## 2023-02-06 DIAGNOSIS — Z1231 Encounter for screening mammogram for malignant neoplasm of breast: Secondary | ICD-10-CM

## 2023-02-26 DIAGNOSIS — M792 Neuralgia and neuritis, unspecified: Secondary | ICD-10-CM | POA: Diagnosis not present

## 2023-02-26 DIAGNOSIS — E1140 Type 2 diabetes mellitus with diabetic neuropathy, unspecified: Secondary | ICD-10-CM | POA: Diagnosis not present

## 2023-02-26 DIAGNOSIS — M79672 Pain in left foot: Secondary | ICD-10-CM | POA: Diagnosis not present

## 2023-02-26 DIAGNOSIS — M79671 Pain in right foot: Secondary | ICD-10-CM | POA: Diagnosis not present

## 2023-04-13 DIAGNOSIS — M79671 Pain in right foot: Secondary | ICD-10-CM | POA: Diagnosis not present

## 2023-04-13 DIAGNOSIS — M792 Neuralgia and neuritis, unspecified: Secondary | ICD-10-CM | POA: Diagnosis not present

## 2023-04-13 DIAGNOSIS — M79672 Pain in left foot: Secondary | ICD-10-CM | POA: Diagnosis not present

## 2023-05-14 DIAGNOSIS — M79672 Pain in left foot: Secondary | ICD-10-CM | POA: Diagnosis not present

## 2023-05-14 DIAGNOSIS — E1140 Type 2 diabetes mellitus with diabetic neuropathy, unspecified: Secondary | ICD-10-CM | POA: Diagnosis not present

## 2023-05-14 DIAGNOSIS — M792 Neuralgia and neuritis, unspecified: Secondary | ICD-10-CM | POA: Diagnosis not present

## 2023-05-14 DIAGNOSIS — M79671 Pain in right foot: Secondary | ICD-10-CM | POA: Diagnosis not present

## 2023-06-11 DIAGNOSIS — M79671 Pain in right foot: Secondary | ICD-10-CM | POA: Diagnosis not present

## 2023-06-11 DIAGNOSIS — M79672 Pain in left foot: Secondary | ICD-10-CM | POA: Diagnosis not present

## 2023-06-11 DIAGNOSIS — E1140 Type 2 diabetes mellitus with diabetic neuropathy, unspecified: Secondary | ICD-10-CM | POA: Diagnosis not present

## 2023-06-11 DIAGNOSIS — M792 Neuralgia and neuritis, unspecified: Secondary | ICD-10-CM | POA: Diagnosis not present

## 2023-06-22 DIAGNOSIS — M79671 Pain in right foot: Secondary | ICD-10-CM | POA: Diagnosis not present

## 2023-06-22 DIAGNOSIS — M79672 Pain in left foot: Secondary | ICD-10-CM | POA: Diagnosis not present

## 2023-06-22 DIAGNOSIS — I739 Peripheral vascular disease, unspecified: Secondary | ICD-10-CM | POA: Diagnosis not present

## 2023-06-22 DIAGNOSIS — L110 Acquired keratosis follicularis: Secondary | ICD-10-CM | POA: Diagnosis not present

## 2023-06-30 DIAGNOSIS — Z299 Encounter for prophylactic measures, unspecified: Secondary | ICD-10-CM | POA: Diagnosis not present

## 2023-06-30 DIAGNOSIS — R2681 Unsteadiness on feet: Secondary | ICD-10-CM | POA: Diagnosis not present

## 2023-06-30 DIAGNOSIS — M1990 Unspecified osteoarthritis, unspecified site: Secondary | ICD-10-CM | POA: Diagnosis not present

## 2023-06-30 DIAGNOSIS — G629 Polyneuropathy, unspecified: Secondary | ICD-10-CM | POA: Diagnosis not present

## 2023-06-30 DIAGNOSIS — I10 Essential (primary) hypertension: Secondary | ICD-10-CM | POA: Diagnosis not present

## 2023-07-09 DIAGNOSIS — M79672 Pain in left foot: Secondary | ICD-10-CM | POA: Diagnosis not present

## 2023-07-09 DIAGNOSIS — M792 Neuralgia and neuritis, unspecified: Secondary | ICD-10-CM | POA: Diagnosis not present

## 2023-07-09 DIAGNOSIS — M79671 Pain in right foot: Secondary | ICD-10-CM | POA: Diagnosis not present

## 2023-07-09 DIAGNOSIS — G5790 Unspecified mononeuropathy of unspecified lower limb: Secondary | ICD-10-CM | POA: Diagnosis not present

## 2023-08-06 DIAGNOSIS — G5750 Tarsal tunnel syndrome, unspecified lower limb: Secondary | ICD-10-CM | POA: Diagnosis not present

## 2023-08-06 DIAGNOSIS — M792 Neuralgia and neuritis, unspecified: Secondary | ICD-10-CM | POA: Diagnosis not present

## 2023-08-06 DIAGNOSIS — M79671 Pain in right foot: Secondary | ICD-10-CM | POA: Diagnosis not present

## 2023-08-06 DIAGNOSIS — M79672 Pain in left foot: Secondary | ICD-10-CM | POA: Diagnosis not present

## 2023-08-31 DIAGNOSIS — L110 Acquired keratosis follicularis: Secondary | ICD-10-CM | POA: Diagnosis not present

## 2023-08-31 DIAGNOSIS — M79671 Pain in right foot: Secondary | ICD-10-CM | POA: Diagnosis not present

## 2023-08-31 DIAGNOSIS — M79672 Pain in left foot: Secondary | ICD-10-CM | POA: Diagnosis not present

## 2023-08-31 DIAGNOSIS — I739 Peripheral vascular disease, unspecified: Secondary | ICD-10-CM | POA: Diagnosis not present

## 2023-09-03 DIAGNOSIS — M79671 Pain in right foot: Secondary | ICD-10-CM | POA: Diagnosis not present

## 2023-09-03 DIAGNOSIS — M79672 Pain in left foot: Secondary | ICD-10-CM | POA: Diagnosis not present

## 2023-09-03 DIAGNOSIS — M792 Neuralgia and neuritis, unspecified: Secondary | ICD-10-CM | POA: Diagnosis not present

## 2023-09-03 DIAGNOSIS — I739 Peripheral vascular disease, unspecified: Secondary | ICD-10-CM | POA: Diagnosis not present

## 2023-10-01 DIAGNOSIS — I739 Peripheral vascular disease, unspecified: Secondary | ICD-10-CM | POA: Diagnosis not present

## 2023-10-01 DIAGNOSIS — M79671 Pain in right foot: Secondary | ICD-10-CM | POA: Diagnosis not present

## 2023-10-01 DIAGNOSIS — M79672 Pain in left foot: Secondary | ICD-10-CM | POA: Diagnosis not present

## 2023-10-01 DIAGNOSIS — E1140 Type 2 diabetes mellitus with diabetic neuropathy, unspecified: Secondary | ICD-10-CM | POA: Diagnosis not present

## 2023-10-29 DIAGNOSIS — M79671 Pain in right foot: Secondary | ICD-10-CM | POA: Diagnosis not present

## 2023-10-29 DIAGNOSIS — M79672 Pain in left foot: Secondary | ICD-10-CM | POA: Diagnosis not present

## 2023-10-29 DIAGNOSIS — M79675 Pain in left toe(s): Secondary | ICD-10-CM | POA: Diagnosis not present

## 2023-10-29 DIAGNOSIS — E1140 Type 2 diabetes mellitus with diabetic neuropathy, unspecified: Secondary | ICD-10-CM | POA: Diagnosis not present

## 2023-10-29 DIAGNOSIS — L110 Acquired keratosis follicularis: Secondary | ICD-10-CM | POA: Diagnosis not present

## 2023-10-29 DIAGNOSIS — M79674 Pain in right toe(s): Secondary | ICD-10-CM | POA: Diagnosis not present

## 2023-11-23 DIAGNOSIS — M79672 Pain in left foot: Secondary | ICD-10-CM | POA: Diagnosis not present

## 2023-11-23 DIAGNOSIS — M79671 Pain in right foot: Secondary | ICD-10-CM | POA: Diagnosis not present

## 2023-11-23 DIAGNOSIS — I739 Peripheral vascular disease, unspecified: Secondary | ICD-10-CM | POA: Diagnosis not present

## 2023-11-23 DIAGNOSIS — L110 Acquired keratosis follicularis: Secondary | ICD-10-CM | POA: Diagnosis not present

## 2023-11-26 DIAGNOSIS — M792 Neuralgia and neuritis, unspecified: Secondary | ICD-10-CM | POA: Diagnosis not present

## 2023-11-26 DIAGNOSIS — I739 Peripheral vascular disease, unspecified: Secondary | ICD-10-CM | POA: Diagnosis not present

## 2023-11-26 DIAGNOSIS — M79671 Pain in right foot: Secondary | ICD-10-CM | POA: Diagnosis not present

## 2023-11-26 DIAGNOSIS — M79672 Pain in left foot: Secondary | ICD-10-CM | POA: Diagnosis not present

## 2023-12-29 DIAGNOSIS — R5383 Other fatigue: Secondary | ICD-10-CM | POA: Diagnosis not present

## 2023-12-29 DIAGNOSIS — E559 Vitamin D deficiency, unspecified: Secondary | ICD-10-CM | POA: Diagnosis not present

## 2023-12-29 DIAGNOSIS — E7800 Pure hypercholesterolemia, unspecified: Secondary | ICD-10-CM | POA: Diagnosis not present

## 2023-12-29 DIAGNOSIS — M79671 Pain in right foot: Secondary | ICD-10-CM | POA: Diagnosis not present

## 2023-12-29 DIAGNOSIS — Z1389 Encounter for screening for other disorder: Secondary | ICD-10-CM | POA: Diagnosis not present

## 2023-12-29 DIAGNOSIS — R52 Pain, unspecified: Secondary | ICD-10-CM | POA: Diagnosis not present

## 2023-12-29 DIAGNOSIS — Z7189 Other specified counseling: Secondary | ICD-10-CM | POA: Diagnosis not present

## 2023-12-29 DIAGNOSIS — Z713 Dietary counseling and surveillance: Secondary | ICD-10-CM | POA: Diagnosis not present

## 2023-12-29 DIAGNOSIS — Z299 Encounter for prophylactic measures, unspecified: Secondary | ICD-10-CM | POA: Diagnosis not present

## 2023-12-29 DIAGNOSIS — I739 Peripheral vascular disease, unspecified: Secondary | ICD-10-CM | POA: Diagnosis not present

## 2023-12-29 DIAGNOSIS — I10 Essential (primary) hypertension: Secondary | ICD-10-CM | POA: Diagnosis not present

## 2023-12-29 DIAGNOSIS — M79672 Pain in left foot: Secondary | ICD-10-CM | POA: Diagnosis not present

## 2023-12-29 DIAGNOSIS — E1140 Type 2 diabetes mellitus with diabetic neuropathy, unspecified: Secondary | ICD-10-CM | POA: Diagnosis not present

## 2023-12-29 DIAGNOSIS — Z0000 Encounter for general adult medical examination without abnormal findings: Secondary | ICD-10-CM | POA: Diagnosis not present

## 2024-01-05 DIAGNOSIS — I10 Essential (primary) hypertension: Secondary | ICD-10-CM | POA: Diagnosis not present

## 2024-01-05 DIAGNOSIS — R52 Pain, unspecified: Secondary | ICD-10-CM | POA: Diagnosis not present

## 2024-01-05 DIAGNOSIS — Z713 Dietary counseling and surveillance: Secondary | ICD-10-CM | POA: Diagnosis not present

## 2024-01-05 DIAGNOSIS — Z299 Encounter for prophylactic measures, unspecified: Secondary | ICD-10-CM | POA: Diagnosis not present

## 2024-01-05 DIAGNOSIS — Z0000 Encounter for general adult medical examination without abnormal findings: Secondary | ICD-10-CM | POA: Diagnosis not present

## 2024-02-05 DIAGNOSIS — M792 Neuralgia and neuritis, unspecified: Secondary | ICD-10-CM | POA: Diagnosis not present

## 2024-02-05 DIAGNOSIS — M79672 Pain in left foot: Secondary | ICD-10-CM | POA: Diagnosis not present

## 2024-02-05 DIAGNOSIS — M79671 Pain in right foot: Secondary | ICD-10-CM | POA: Diagnosis not present

## 2024-02-17 DIAGNOSIS — M79671 Pain in right foot: Secondary | ICD-10-CM | POA: Diagnosis not present

## 2024-02-17 DIAGNOSIS — M79672 Pain in left foot: Secondary | ICD-10-CM | POA: Diagnosis not present

## 2024-02-17 DIAGNOSIS — L110 Acquired keratosis follicularis: Secondary | ICD-10-CM | POA: Diagnosis not present

## 2024-02-17 DIAGNOSIS — I739 Peripheral vascular disease, unspecified: Secondary | ICD-10-CM | POA: Diagnosis not present

## 2024-03-03 DIAGNOSIS — M79672 Pain in left foot: Secondary | ICD-10-CM | POA: Diagnosis not present

## 2024-03-03 DIAGNOSIS — L110 Acquired keratosis follicularis: Secondary | ICD-10-CM | POA: Diagnosis not present

## 2024-03-03 DIAGNOSIS — M792 Neuralgia and neuritis, unspecified: Secondary | ICD-10-CM | POA: Diagnosis not present

## 2024-03-03 DIAGNOSIS — M79671 Pain in right foot: Secondary | ICD-10-CM | POA: Diagnosis not present

## 2024-04-12 NOTE — Progress Notes (Signed)
 Dawn Gross                                          MRN: [id number]   04/12/2024   The [facility] Quality Team Specialist reviewed this patient medical record for the purposes of chart review for care gap closure. The following were reviewed: chart review for care gap closure-kidney health evaluation for diabetes:eGFR  and uACR.    [facility] Quality Team

## 2024-04-12 NOTE — Progress Notes (Signed)
 Dawn Gross                                          MRN: [id number]   04/12/2024   The [facility] Quality Team Specialist reviewed this patient medical record for the purposes of chart review for care gap closure. The following were reviewed: chart review for care gap closure-glycemic status assessment.    [facility] Quality Team

## 2024-06-09 NOTE — Progress Notes (Signed)
 Dawn Gross                                          MRN: [id number]   06/09/2024   The [facility] Quality Team Specialist reviewed this patient medical record for the purposes of chart review for care gap closure. The following were reviewed: chart review for care gap closure-glycemic status assessment.    [facility] Quality Team
